# Patient Record
Sex: Male | Born: 1935 | Race: White | Hispanic: No | Smoking: Former smoker
Health system: Southern US, Community
[De-identification: ages and names within clinical notes are randomized; demographics above are authoritative.]

## PROBLEM LIST (undated history)

## (undated) DIAGNOSIS — Z8719 Personal history of other diseases of the digestive system: Secondary | ICD-10-CM

## (undated) DIAGNOSIS — F329 Major depressive disorder, single episode, unspecified: Secondary | ICD-10-CM

## (undated) DIAGNOSIS — F410 Panic disorder [episodic paroxysmal anxiety] without agoraphobia: Secondary | ICD-10-CM

## (undated) DIAGNOSIS — F32A Depression, unspecified: Secondary | ICD-10-CM

## (undated) DIAGNOSIS — R42 Dizziness and giddiness: Secondary | ICD-10-CM

## (undated) DIAGNOSIS — Z87442 Personal history of urinary calculi: Secondary | ICD-10-CM

## (undated) DIAGNOSIS — D649 Anemia, unspecified: Secondary | ICD-10-CM

## (undated) DIAGNOSIS — E785 Hyperlipidemia, unspecified: Secondary | ICD-10-CM

## (undated) DIAGNOSIS — K635 Polyp of colon: Secondary | ICD-10-CM

## (undated) DIAGNOSIS — D696 Thrombocytopenia, unspecified: Secondary | ICD-10-CM

## (undated) DIAGNOSIS — H9190 Unspecified hearing loss, unspecified ear: Secondary | ICD-10-CM

## (undated) DIAGNOSIS — Z22322 Carrier or suspected carrier of Methicillin resistant Staphylococcus aureus: Secondary | ICD-10-CM

## (undated) DIAGNOSIS — K2270 Barrett's esophagus without dysplasia: Secondary | ICD-10-CM

## (undated) DIAGNOSIS — F1020 Alcohol dependence, uncomplicated: Secondary | ICD-10-CM

## (undated) DIAGNOSIS — IMO0001 Reserved for inherently not codable concepts without codable children: Secondary | ICD-10-CM

## (undated) DIAGNOSIS — I10 Essential (primary) hypertension: Secondary | ICD-10-CM

## (undated) DIAGNOSIS — M25569 Pain in unspecified knee: Secondary | ICD-10-CM

## (undated) DIAGNOSIS — K219 Gastro-esophageal reflux disease without esophagitis: Secondary | ICD-10-CM

## (undated) DIAGNOSIS — M1990 Unspecified osteoarthritis, unspecified site: Secondary | ICD-10-CM

## (undated) HISTORY — DX: Carrier or suspected carrier of methicillin resistant Staphylococcus aureus: Z22.322

## (undated) HISTORY — DX: Barrett's esophagus without dysplasia: K22.70

## (undated) HISTORY — DX: Essential (primary) hypertension: I10

## (undated) HISTORY — DX: Reserved for inherently not codable concepts without codable children: IMO0001

## (undated) HISTORY — DX: Alcohol dependence, uncomplicated: F10.20

## (undated) HISTORY — DX: Major depressive disorder, single episode, unspecified: F32.9

## (undated) HISTORY — DX: Hyperlipidemia, unspecified: E78.5

## (undated) HISTORY — DX: Thrombocytopenia, unspecified: D69.6

## (undated) HISTORY — DX: Depression, unspecified: F32.A

## (undated) HISTORY — DX: Unspecified hearing loss, unspecified ear: H91.90

## (undated) HISTORY — DX: Anemia, unspecified: D64.9

## (undated) HISTORY — DX: Unspecified osteoarthritis, unspecified site: M19.90

## (undated) HISTORY — PX: OTHER SURGICAL HISTORY: SHX169

## (undated) HISTORY — PX: TONSILLECTOMY: SUR1361

## (undated) HISTORY — DX: Personal history of other diseases of the digestive system: Z87.19

## (undated) HISTORY — PX: APPENDECTOMY: SHX54

## (undated) HISTORY — PX: CARDIAC CATHETERIZATION: SHX172

## (undated) HISTORY — DX: Polyp of colon: K63.5

---

## 2000-10-11 ENCOUNTER — Inpatient Hospital Stay (HOSPITAL_COMMUNITY): Admission: RE | Admit: 2000-10-11 | Discharge: 2000-10-13 | Payer: Self-pay | Admitting: Emergency Medicine

## 2000-10-11 ENCOUNTER — Encounter: Payer: Self-pay | Admitting: Emergency Medicine

## 2000-11-25 ENCOUNTER — Inpatient Hospital Stay (HOSPITAL_COMMUNITY): Admission: EM | Admit: 2000-11-25 | Discharge: 2000-11-29 | Payer: Self-pay | Admitting: *Deleted

## 2000-11-25 ENCOUNTER — Encounter: Payer: Self-pay | Admitting: Emergency Medicine

## 2001-03-21 ENCOUNTER — Inpatient Hospital Stay (HOSPITAL_COMMUNITY): Admission: EM | Admit: 2001-03-21 | Discharge: 2001-03-26 | Payer: Self-pay | Admitting: Psychiatry

## 2001-03-21 ENCOUNTER — Emergency Department (HOSPITAL_COMMUNITY): Admission: EM | Admit: 2001-03-21 | Discharge: 2001-03-21 | Payer: Self-pay | Admitting: *Deleted

## 2001-10-05 ENCOUNTER — Inpatient Hospital Stay (HOSPITAL_COMMUNITY): Admission: EM | Admit: 2001-10-05 | Discharge: 2001-10-08 | Payer: Self-pay | Admitting: Psychiatry

## 2001-12-11 ENCOUNTER — Inpatient Hospital Stay (HOSPITAL_COMMUNITY): Admission: EM | Admit: 2001-12-11 | Discharge: 2001-12-15 | Payer: Self-pay | Admitting: Psychiatry

## 2002-02-20 ENCOUNTER — Inpatient Hospital Stay (HOSPITAL_COMMUNITY): Admission: EM | Admit: 2002-02-20 | Discharge: 2002-02-22 | Payer: Self-pay | Admitting: *Deleted

## 2002-03-01 ENCOUNTER — Encounter: Admission: RE | Admit: 2002-03-01 | Discharge: 2002-03-01 | Payer: Self-pay | Admitting: Family Medicine

## 2002-06-14 ENCOUNTER — Encounter: Payer: Self-pay | Admitting: Emergency Medicine

## 2002-06-14 ENCOUNTER — Inpatient Hospital Stay (HOSPITAL_COMMUNITY): Admission: EM | Admit: 2002-06-14 | Discharge: 2002-06-19 | Payer: Self-pay | Admitting: Emergency Medicine

## 2002-10-22 ENCOUNTER — Emergency Department (HOSPITAL_COMMUNITY): Admission: EM | Admit: 2002-10-22 | Discharge: 2002-10-23 | Payer: Self-pay | Admitting: Emergency Medicine

## 2002-10-22 ENCOUNTER — Encounter: Payer: Self-pay | Admitting: Emergency Medicine

## 2002-10-23 ENCOUNTER — Inpatient Hospital Stay (HOSPITAL_COMMUNITY): Admission: EM | Admit: 2002-10-23 | Discharge: 2002-10-26 | Payer: Self-pay | Admitting: Psychiatry

## 2003-01-01 ENCOUNTER — Emergency Department (HOSPITAL_COMMUNITY): Admission: AD | Admit: 2003-01-01 | Discharge: 2003-01-02 | Payer: Self-pay | Admitting: Emergency Medicine

## 2004-01-13 ENCOUNTER — Emergency Department (HOSPITAL_COMMUNITY): Admission: EM | Admit: 2004-01-13 | Discharge: 2004-01-13 | Payer: Self-pay | Admitting: *Deleted

## 2005-03-25 ENCOUNTER — Ambulatory Visit (HOSPITAL_COMMUNITY): Admission: RE | Admit: 2005-03-25 | Discharge: 2005-03-25 | Payer: Self-pay | Admitting: Ophthalmology

## 2005-11-03 ENCOUNTER — Emergency Department (HOSPITAL_COMMUNITY): Admission: EM | Admit: 2005-11-03 | Discharge: 2005-11-04 | Payer: Self-pay | Admitting: Emergency Medicine

## 2006-03-10 ENCOUNTER — Inpatient Hospital Stay (HOSPITAL_COMMUNITY): Admission: EM | Admit: 2006-03-10 | Discharge: 2006-03-12 | Payer: Self-pay | Admitting: Emergency Medicine

## 2006-03-21 ENCOUNTER — Inpatient Hospital Stay (HOSPITAL_COMMUNITY): Admission: EM | Admit: 2006-03-21 | Discharge: 2006-03-24 | Payer: Self-pay | Admitting: Emergency Medicine

## 2006-06-28 ENCOUNTER — Emergency Department (HOSPITAL_COMMUNITY): Admission: EM | Admit: 2006-06-28 | Discharge: 2006-06-28 | Payer: Self-pay | Admitting: Emergency Medicine

## 2006-06-29 ENCOUNTER — Emergency Department (HOSPITAL_COMMUNITY): Admission: EM | Admit: 2006-06-29 | Discharge: 2006-06-29 | Payer: Self-pay | Admitting: Emergency Medicine

## 2006-08-23 ENCOUNTER — Emergency Department (HOSPITAL_COMMUNITY): Admission: EM | Admit: 2006-08-23 | Discharge: 2006-08-24 | Payer: Self-pay | Admitting: Emergency Medicine

## 2006-10-19 ENCOUNTER — Inpatient Hospital Stay (HOSPITAL_COMMUNITY): Admission: EM | Admit: 2006-10-19 | Discharge: 2006-10-22 | Payer: Self-pay | Admitting: Emergency Medicine

## 2006-12-23 ENCOUNTER — Inpatient Hospital Stay (HOSPITAL_COMMUNITY): Admission: EM | Admit: 2006-12-23 | Discharge: 2006-12-24 | Payer: Self-pay | Admitting: Emergency Medicine

## 2006-12-24 ENCOUNTER — Ambulatory Visit: Payer: Self-pay | Admitting: Psychiatry

## 2006-12-24 ENCOUNTER — Inpatient Hospital Stay (HOSPITAL_COMMUNITY): Admission: AD | Admit: 2006-12-24 | Discharge: 2006-12-28 | Payer: Self-pay | Admitting: Psychiatry

## 2007-02-04 ENCOUNTER — Ambulatory Visit: Payer: Self-pay | Admitting: Orthopedic Surgery

## 2007-03-13 ENCOUNTER — Inpatient Hospital Stay (HOSPITAL_COMMUNITY): Admission: EM | Admit: 2007-03-13 | Discharge: 2007-03-15 | Payer: Self-pay | Admitting: Emergency Medicine

## 2007-04-30 ENCOUNTER — Ambulatory Visit: Payer: Self-pay | Admitting: Internal Medicine

## 2007-04-30 ENCOUNTER — Inpatient Hospital Stay (HOSPITAL_COMMUNITY): Admission: EM | Admit: 2007-04-30 | Discharge: 2007-05-03 | Payer: Self-pay | Admitting: Emergency Medicine

## 2007-04-30 HISTORY — PX: ESOPHAGOGASTRODUODENOSCOPY: SHX1529

## 2007-05-01 ENCOUNTER — Ambulatory Visit: Payer: Self-pay | Admitting: Internal Medicine

## 2007-05-01 ENCOUNTER — Encounter: Payer: Self-pay | Admitting: Internal Medicine

## 2007-05-03 HISTORY — PX: EUS: SHX5427

## 2007-05-13 ENCOUNTER — Encounter: Payer: Self-pay | Admitting: Internal Medicine

## 2007-05-13 ENCOUNTER — Ambulatory Visit: Payer: Self-pay | Admitting: Internal Medicine

## 2007-05-13 ENCOUNTER — Ambulatory Visit (HOSPITAL_COMMUNITY): Admission: RE | Admit: 2007-05-13 | Discharge: 2007-05-13 | Payer: Self-pay | Admitting: Internal Medicine

## 2007-05-13 HISTORY — PX: COLONOSCOPY: SHX174

## 2007-05-20 ENCOUNTER — Encounter: Payer: Self-pay | Admitting: Gastroenterology

## 2007-05-20 ENCOUNTER — Ambulatory Visit (HOSPITAL_COMMUNITY): Admission: RE | Admit: 2007-05-20 | Discharge: 2007-05-20 | Payer: Self-pay | Admitting: Gastroenterology

## 2007-05-25 ENCOUNTER — Ambulatory Visit: Payer: Self-pay | Admitting: Gastroenterology

## 2007-06-09 ENCOUNTER — Emergency Department (HOSPITAL_COMMUNITY): Admission: EM | Admit: 2007-06-09 | Discharge: 2007-06-10 | Payer: Self-pay | Admitting: Emergency Medicine

## 2007-08-13 ENCOUNTER — Emergency Department (HOSPITAL_COMMUNITY): Admission: EM | Admit: 2007-08-13 | Discharge: 2007-08-14 | Payer: Self-pay | Admitting: Emergency Medicine

## 2007-08-24 ENCOUNTER — Ambulatory Visit: Payer: Self-pay | Admitting: Orthopedic Surgery

## 2007-09-01 ENCOUNTER — Ambulatory Visit: Payer: Self-pay | Admitting: Orthopedic Surgery

## 2007-09-17 ENCOUNTER — Emergency Department (HOSPITAL_COMMUNITY): Admission: EM | Admit: 2007-09-17 | Discharge: 2007-09-18 | Payer: Self-pay | Admitting: Emergency Medicine

## 2008-01-15 ENCOUNTER — Emergency Department (HOSPITAL_COMMUNITY): Admission: EM | Admit: 2008-01-15 | Discharge: 2008-01-15 | Payer: Self-pay | Admitting: Emergency Medicine

## 2008-01-22 ENCOUNTER — Emergency Department (HOSPITAL_COMMUNITY): Admission: EM | Admit: 2008-01-22 | Discharge: 2008-01-22 | Payer: Self-pay | Admitting: Emergency Medicine

## 2008-02-09 ENCOUNTER — Ambulatory Visit (HOSPITAL_COMMUNITY): Admission: RE | Admit: 2008-02-09 | Discharge: 2008-02-09 | Payer: Self-pay | Admitting: Family Medicine

## 2008-03-20 ENCOUNTER — Other Ambulatory Visit: Payer: Self-pay | Admitting: Emergency Medicine

## 2008-03-20 ENCOUNTER — Ambulatory Visit: Payer: Self-pay | Admitting: *Deleted

## 2008-03-20 ENCOUNTER — Inpatient Hospital Stay (HOSPITAL_COMMUNITY): Admission: RE | Admit: 2008-03-20 | Discharge: 2008-03-24 | Payer: Self-pay | Admitting: *Deleted

## 2008-11-17 ENCOUNTER — Encounter (INDEPENDENT_AMBULATORY_CARE_PROVIDER_SITE_OTHER): Payer: Self-pay | Admitting: *Deleted

## 2008-12-17 ENCOUNTER — Inpatient Hospital Stay (HOSPITAL_COMMUNITY): Admission: EM | Admit: 2008-12-17 | Discharge: 2008-12-20 | Payer: Self-pay | Admitting: Emergency Medicine

## 2009-01-05 IMAGING — CR DG CHEST 2V
2 series · 2 of 2 positions shown · non-contrast
Comparison: 01/15/2008

CLINICAL DATA: Shortness of breath, chest tightness, cough,
congestion

CHEST - 2 VIEW

[view not recorded (1 of 2)]
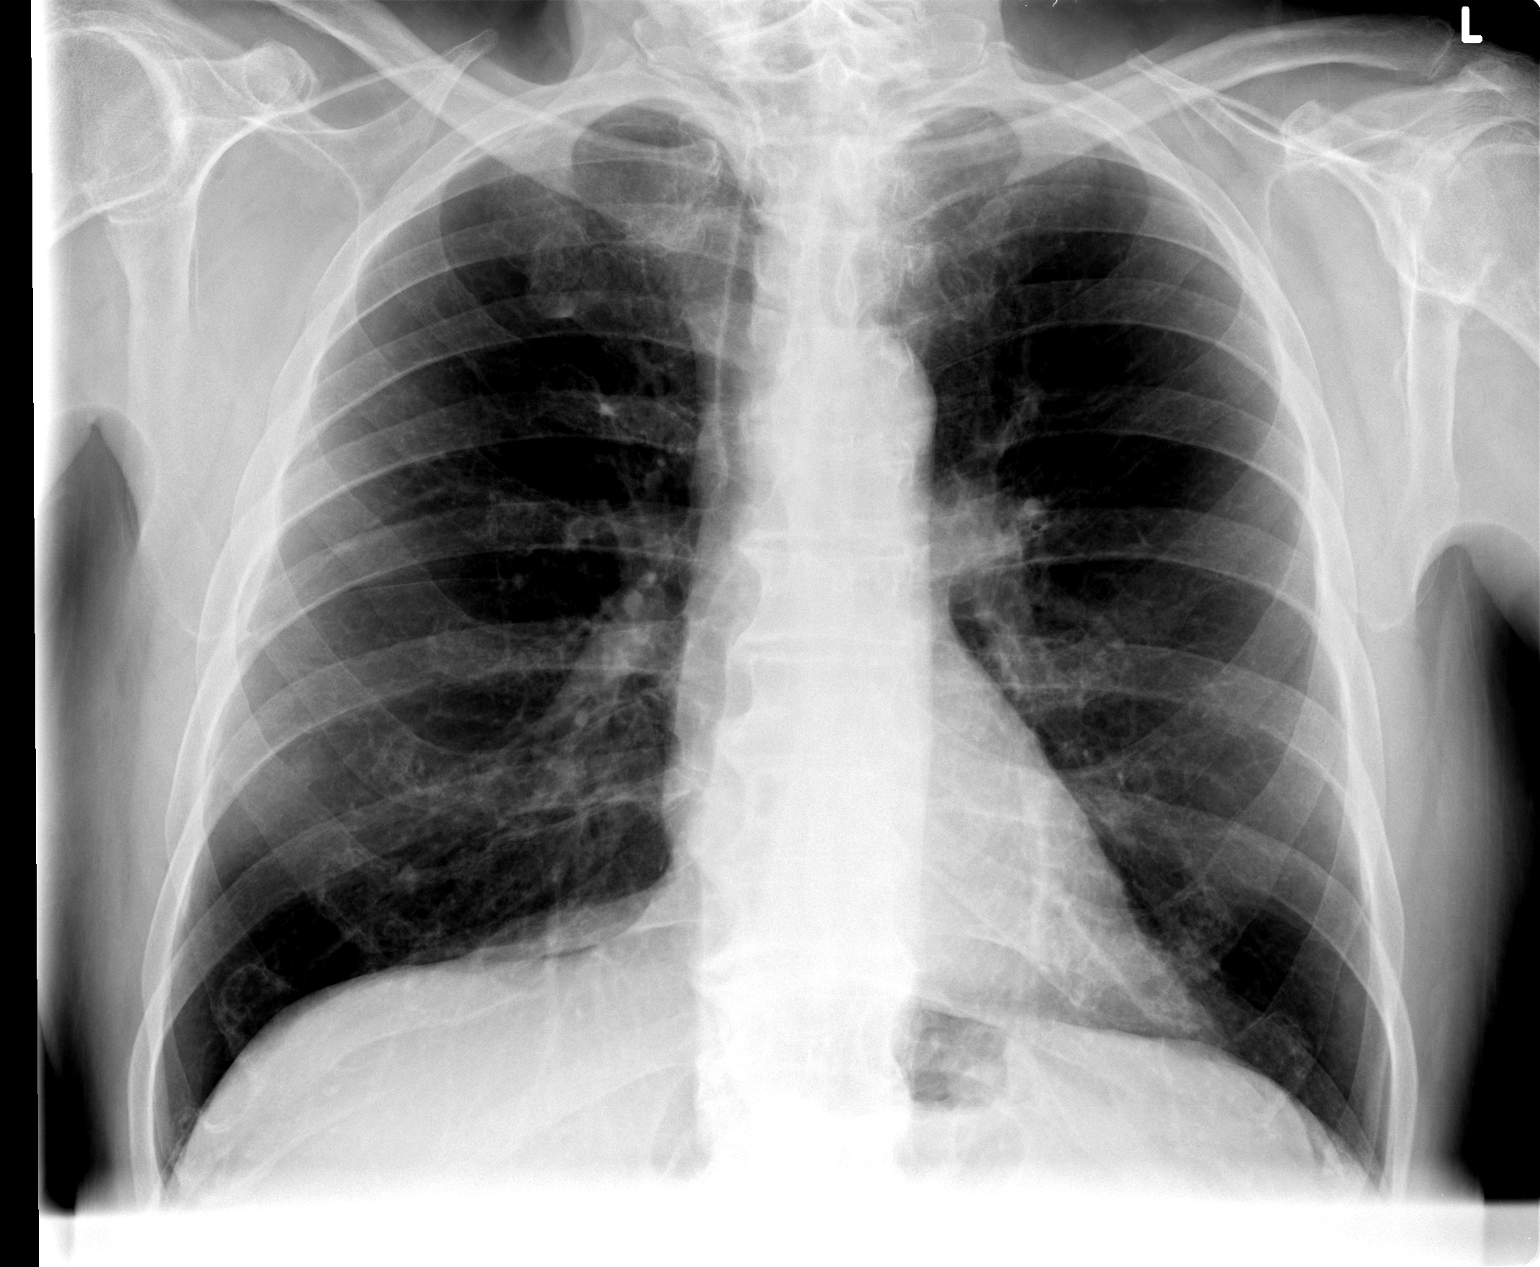

[view not recorded (2 of 2)]
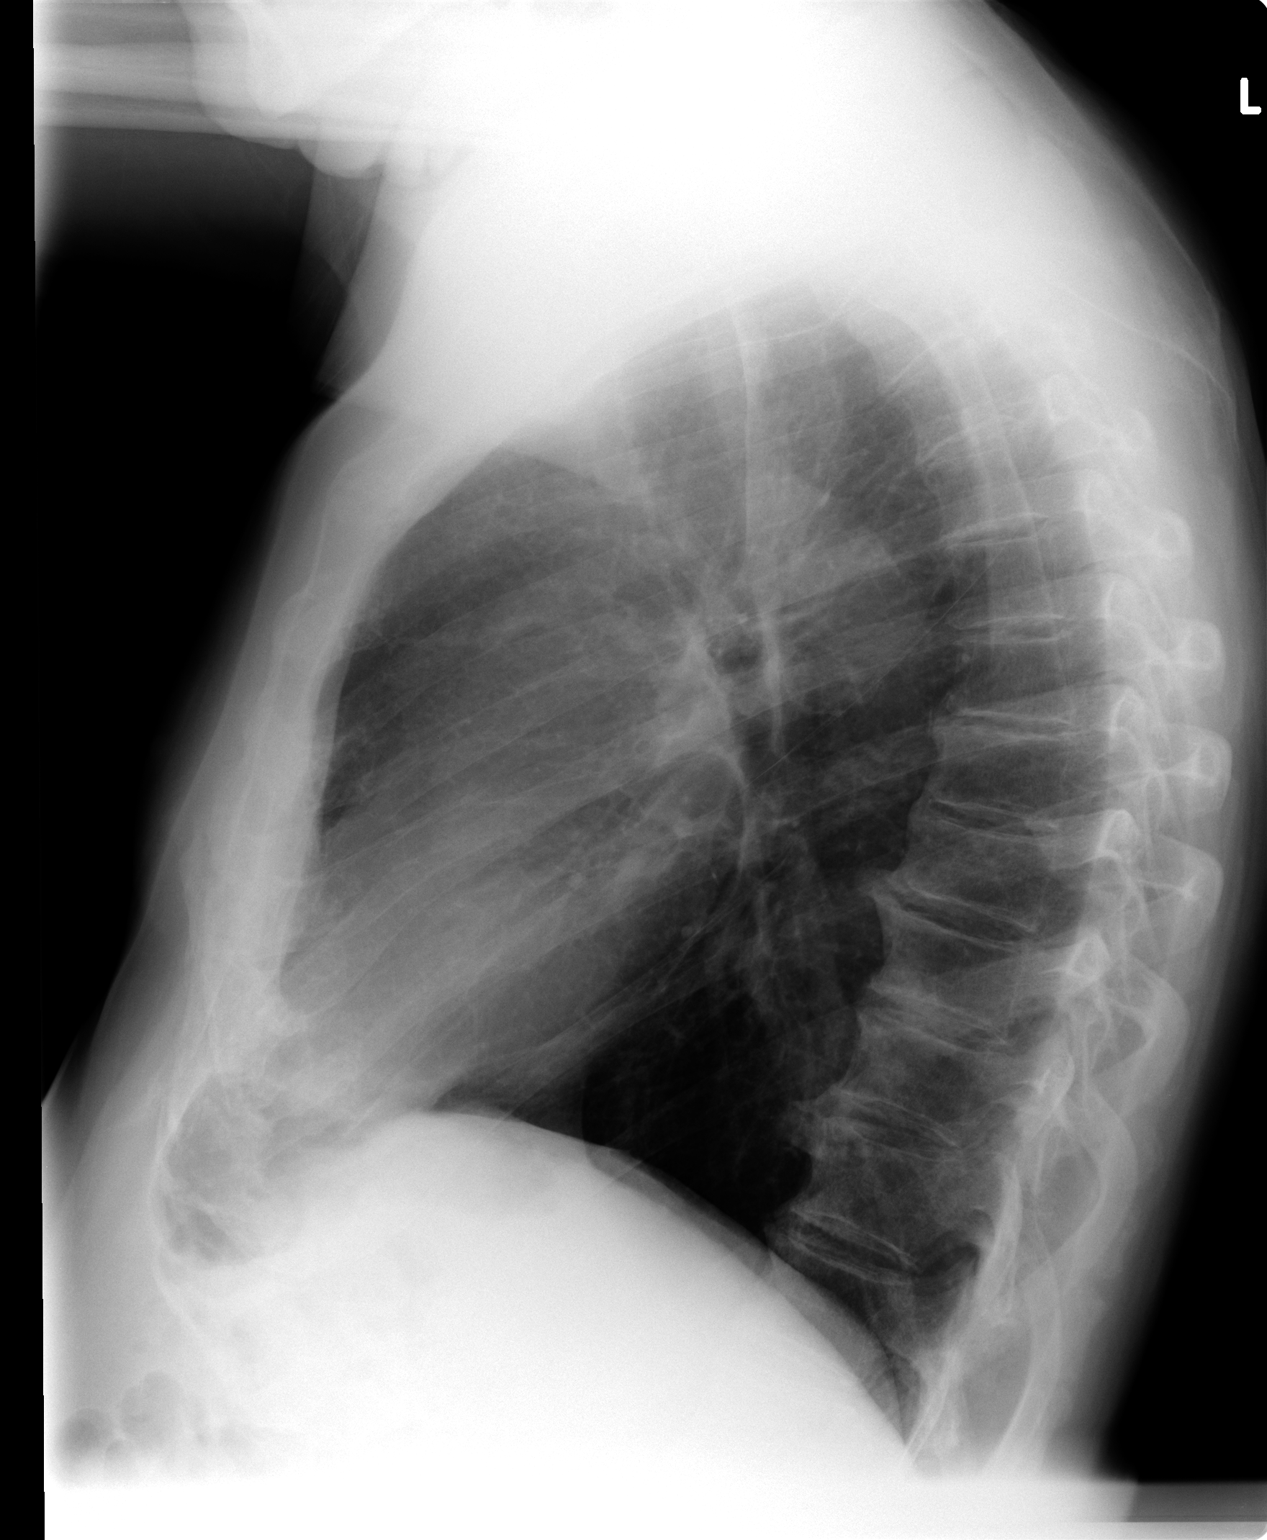

[2 of 2 positions shown; findings below may reference images not displayed]

FINDINGS: Normal heart size, mediastinal contours, and pulmonary vascularity.
Emphysematous changes without infiltrate or effusion.
No pneumothorax.
Endplate spur formation thoracic spine.
IMPRESSION: Question COPD.
No acute abnormalities.

## 2009-06-18 ENCOUNTER — Emergency Department (HOSPITAL_COMMUNITY): Admission: EM | Admit: 2009-06-18 | Discharge: 2009-06-18 | Payer: Self-pay | Admitting: Emergency Medicine

## 2009-06-25 ENCOUNTER — Emergency Department (HOSPITAL_COMMUNITY): Admission: EM | Admit: 2009-06-25 | Discharge: 2009-06-25 | Payer: Self-pay | Admitting: Emergency Medicine

## 2009-08-31 HISTORY — PX: KNEE SURGERY: SHX244

## 2009-09-11 ENCOUNTER — Ambulatory Visit: Payer: Self-pay | Admitting: Cardiology

## 2009-09-11 ENCOUNTER — Ambulatory Visit: Payer: Self-pay | Admitting: Cardiovascular Disease

## 2009-09-11 ENCOUNTER — Inpatient Hospital Stay (HOSPITAL_COMMUNITY): Admission: AD | Admit: 2009-09-11 | Discharge: 2009-09-13 | Payer: Self-pay | Admitting: Cardiovascular Disease

## 2009-09-16 ENCOUNTER — Emergency Department (HOSPITAL_COMMUNITY): Admission: EM | Admit: 2009-09-16 | Discharge: 2009-09-16 | Payer: Self-pay | Admitting: Emergency Medicine

## 2009-09-21 ENCOUNTER — Ambulatory Visit: Payer: Self-pay | Admitting: Orthopedic Surgery

## 2009-09-21 ENCOUNTER — Inpatient Hospital Stay (HOSPITAL_COMMUNITY): Admission: EM | Admit: 2009-09-21 | Discharge: 2009-10-02 | Payer: Self-pay | Admitting: Emergency Medicine

## 2009-09-22 ENCOUNTER — Encounter: Payer: Self-pay | Admitting: Orthopedic Surgery

## 2009-10-03 ENCOUNTER — Telehealth: Payer: Self-pay | Admitting: Orthopedic Surgery

## 2009-10-17 ENCOUNTER — Encounter (HOSPITAL_COMMUNITY): Admission: RE | Admit: 2009-10-17 | Discharge: 2009-11-16 | Payer: Self-pay | Admitting: Family Medicine

## 2009-10-17 ENCOUNTER — Ambulatory Visit (HOSPITAL_COMMUNITY): Payer: Self-pay | Admitting: Family Medicine

## 2009-10-18 ENCOUNTER — Ambulatory Visit: Payer: Self-pay | Admitting: Orthopedic Surgery

## 2009-10-18 DIAGNOSIS — M009 Pyogenic arthritis, unspecified: Secondary | ICD-10-CM | POA: Insufficient documentation

## 2009-12-20 ENCOUNTER — Encounter: Payer: Self-pay | Admitting: Orthopedic Surgery

## 2010-01-04 ENCOUNTER — Encounter: Payer: Self-pay | Admitting: Orthopedic Surgery

## 2010-01-15 ENCOUNTER — Emergency Department (HOSPITAL_COMMUNITY): Admission: EM | Admit: 2010-01-15 | Discharge: 2010-01-16 | Payer: Self-pay | Admitting: Emergency Medicine

## 2010-06-11 ENCOUNTER — Encounter (INDEPENDENT_AMBULATORY_CARE_PROVIDER_SITE_OTHER): Payer: Self-pay

## 2010-07-02 NOTE — Progress Notes (Signed)
Summary: call from [facility] for order  Phone Note From Other Clinic   Caller: Nurse Summary of Call: Erskine Squibb, Chiropodist at [facility] [phone]- called to request order re: Cryocuff. States patient admittedt to their facility. Please advise. Initial call taken by: Cammie Sickle,  Oct 03, 2009 2:26 PM  Follow-up for Phone Call        daily 30 minutes  Follow-up by: Fuller Canada MD,  Oct 03, 2009 2:29 PM  Additional Follow-up for Phone Call Additional follow up Details #1::        advised. Additional Follow-up by: Cammie Sickle,  Oct 03, 2009 3:39 PM

## 2010-07-02 NOTE — Assessment & Plan Note (Signed)
Summary: HOSP FOL/UP/POST OP RT KNEE/SURG 09/28/09/MEDICARE ONLY/CAF   Visit Type:  Follow-up  CC:  post op hospital fu.  History of Present Illness: I saw Paulino Piscopo in the office today for a followup visit.  He is a 77 years old man with the complaint of:  post op 1 arthroscopy right knee, debridement and lavage for infection and partial medial menisectomy, MRSA infection.  DOS 09/28/09 after being admitted to the hospital with severe knee pain and swelling.  Vanc trough for review from 10/16/09 ordered by Dr. Sudie Bailey  Meds to be scanned, takes Vanc IV Vicodin for pain helps.  Has therapy everyday at [facility].  Stiff right knee miniaml effusion   ROM was 100-60   Continue PT and IV meds        Allergies: 1)  ! * Aspirin   Impression & Recommendations:  Problem # 1:  PYOGENIC ARTHRITIS, LOWER LEG (ICD-711.06) Assessment Improved  Orders: Post-Op Check (03474)  Patient Instructions: 1)  Please schedule a follow-up appointment in 2 months.

## 2010-07-02 NOTE — Letter (Signed)
Summary: *Orthopedic No Show Letter  [facility]  [address] [place] [address]  [place], [place] [address]   Phone: [phone]  Fax: [phone]       01/04/2010    Lucus L. Knee [address] [place], [place]  [address]    Dear Mr. KIENER,   Our records indicate that you missed your re-scheduled appointment with Dr. Beaulah Corin on 01/03/10.    Please contact this office to reschedule your appointment as soon as possible.  It is important that you keep your scheduled appointments with your physician, so we can provide you the best care possible.        Sincerely,    Dr. Terrance Mass, MD [facility] Phone [phone]

## 2010-07-02 NOTE — Letter (Signed)
Summary: *Orthopedic No Show Letter  [facility]  [address] [place] [address]  [place], [place] [address]   Phone: [phone]  Fax: [phone]      12/20/2009    [name] [address] [place], [place]  [address]      Dear [name],   Our records indicate that you missed your scheduled post-operative appointment with Dr. Beaulah Corin on Wednesday, 12/19/2009.    Please contact this office to reschedule your appointment as soon as possible.  It is important that you keep your scheduled appointments with your physician, so we can provide you the best care possible.        Sincerely,    Dr. Terrance Mass, MD [facility] Phone [phone]

## 2010-07-02 NOTE — Consult Note (Signed)
Summary: Hospital Consult RT knee  Hospital Consult RT knee   Imported By: Cammie Sickle 10/13/2009 17:35:29  _____________________________________________________________________  External Attachment:    Type:   Image     Comment:   External Document

## 2010-07-04 NOTE — Letter (Signed)
Summary: Recall, Screening Colonoscopy Only  [facility]  [address]   [place], [place] [address]   Phone: [phone]  Fax: [phone]    June 11, 2010  [name] [address] [place], [place]  [address] 1927/08/30   Dear [name],   Our records indicate it is time to schedule your colonoscopy.     Please call our office at [phone] and ask for the nurse.   Thank you,  Hendricks Limes, LPN Cloria Spring, LPN  [facility] Ph: [phone]   Fax: [phone]

## 2010-07-05 NOTE — Miscellaneous (Signed)
Summary: Nursing Home order  Nursing Home order   Imported By: Cammie Sickle 10/19/2009 10:19:36  _____________________________________________________________________  External Attachment:    Type:   Image     Comment:   External Document

## 2010-08-15 LAB — DIFFERENTIAL
Basophils Relative: 1 % (ref 0–1)
Eosinophils Absolute: 0.1 10*3/uL (ref 0.0–0.7)
Monocytes Absolute: 0.6 10*3/uL (ref 0.1–1.0)
Monocytes Relative: 9 % (ref 3–12)

## 2010-08-15 LAB — URINALYSIS, ROUTINE W REFLEX MICROSCOPIC
Bilirubin Urine: NEGATIVE
Nitrite: NEGATIVE
Protein, ur: NEGATIVE mg/dL
Specific Gravity, Urine: 1.02 (ref 1.005–1.030)
Urobilinogen, UA: 0.2 mg/dL (ref 0.0–1.0)

## 2010-08-15 LAB — BASIC METABOLIC PANEL
BUN: 9 mg/dL (ref 6–23)
CO2: 23 mEq/L (ref 19–32)
Calcium: 8.1 mg/dL — ABNORMAL LOW (ref 8.4–10.5)
GFR calc non Af Amer: >60 mL/min (ref >60)
Glucose, Bld: 131 mg/dL — ABNORMAL HIGH (ref 70–99)

## 2010-08-15 LAB — URINE MICROSCOPIC-ADD ON

## 2010-08-15 LAB — CBC
HCT: 36.9 % — ABNORMAL LOW (ref 39.0–52.0)
Hemoglobin: 12.4 g/dL — ABNORMAL LOW (ref 13.0–17.0)
WBC: 6.2 10*3/uL (ref 4.0–10.5)

## 2010-08-15 LAB — ETHANOL: Alcohol, Ethyl (B): 342 mg/dL — ABNORMAL HIGH (ref 0–10)

## 2010-08-18 LAB — DIFFERENTIAL
Basophils Absolute: 0.1 10*3/uL (ref 0.0–0.1)
Monocytes Relative: 7 % (ref 3–12)
Neutrophils Relative %: 83 % — ABNORMAL HIGH (ref 43–77)

## 2010-08-18 LAB — POCT CARDIAC MARKERS
Myoglobin, poc: 245 ng/mL (ref 12–200)
Troponin i, poc: <0.05 ng/mL (ref 0.00–0.09)

## 2010-08-18 LAB — POCT I-STAT, CHEM 8
Calcium, Ion: 1.13 mmol/L (ref 1.12–1.32)
HCT: 38 % — ABNORMAL LOW (ref 39.0–52.0)
Potassium: 3.5 mEq/L (ref 3.5–5.1)
Sodium: 136 mEq/L (ref 135–145)

## 2010-08-18 LAB — CBC
Hemoglobin: 11.3 g/dL — ABNORMAL LOW (ref 13.0–17.0)
RBC: 4.83 MIL/uL (ref 4.22–5.81)
RDW: 18.2 % — ABNORMAL HIGH (ref 11.5–15.5)

## 2010-08-20 LAB — BASIC METABOLIC PANEL
BUN: 13 mg/dL (ref 6–23)
BUN: 9 mg/dL (ref 6–23)
CO2: 26 mEq/L (ref 19–32)
CO2: 26 mEq/L (ref 19–32)
CO2: 28 mEq/L (ref 19–32)
CO2: 29 mEq/L (ref 19–32)
Calcium: 8 mg/dL — ABNORMAL LOW (ref 8.4–10.5)
Calcium: 8.1 mg/dL — ABNORMAL LOW (ref 8.4–10.5)
Chloride: 103 mEq/L (ref 96–112)
Chloride: 101 mEq/L (ref 96–112)
Chloride: 101 mEq/L (ref 96–112)
Chloride: 102 mEq/L (ref 96–112)
Chloride: 102 mEq/L (ref 96–112)
Chloride: 103 mEq/L (ref 96–112)
Creatinine, Ser: 0.67 mg/dL (ref 0.4–1.5)
GFR calc Af Amer: >60 mL/min (ref >60)
GFR calc Af Amer: >60 mL/min (ref >60)
GFR calc Af Amer: >60 mL/min (ref >60)
GFR calc Af Amer: >60 mL/min (ref >60)
GFR calc Af Amer: >60 mL/min (ref >60)
GFR calc non Af Amer: >60 mL/min (ref >60)
GFR calc non Af Amer: >60 mL/min (ref >60)
Glucose, Bld: 143 mg/dL — ABNORMAL HIGH (ref 70–99)
Potassium: 3.7 mEq/L (ref 3.5–5.1)
Potassium: 3 mEq/L — ABNORMAL LOW (ref 3.5–5.1)
Potassium: 3.2 mEq/L — ABNORMAL LOW (ref 3.5–5.1)
Potassium: 3.4 mEq/L — ABNORMAL LOW (ref 3.5–5.1)
Potassium: 3.5 mEq/L (ref 3.5–5.1)
Potassium: 3.6 mEq/L (ref 3.5–5.1)
Potassium: 3.9 mEq/L (ref 3.5–5.1)
Sodium: 136 mEq/L (ref 135–145)
Sodium: 132 mEq/L — ABNORMAL LOW (ref 135–145)
Sodium: 135 mEq/L (ref 135–145)
Sodium: 137 mEq/L (ref 135–145)

## 2010-08-20 LAB — DIFFERENTIAL
Basophils Absolute: 0.2 10*3/uL — ABNORMAL HIGH (ref 0.0–0.1)
Basophils Relative: 0 % (ref 0–1)
Basophils Relative: 1 % (ref 0–1)
Eosinophils Absolute: 0 10*3/uL (ref 0.0–0.7)
Eosinophils Absolute: 0 10*3/uL (ref 0.0–0.7)
Eosinophils Absolute: 0 10*3/uL (ref 0.0–0.7)
Eosinophils Absolute: 0.1 10*3/uL (ref 0.0–0.7)
Eosinophils Relative: 0 % (ref 0–5)
Eosinophils Relative: 0 % (ref 0–5)
Eosinophils Relative: 1 % (ref 0–5)
Lymphs Abs: 0.8 10*3/uL (ref 0.7–4.0)
Lymphs Abs: 0.8 10*3/uL (ref 0.7–4.0)
Lymphs Abs: 0.9 10*3/uL (ref 0.7–4.0)
Monocytes Absolute: 0.7 10*3/uL (ref 0.1–1.0)
Monocytes Absolute: 0.7 10*3/uL (ref 0.1–1.0)
Monocytes Absolute: 1 10*3/uL (ref 0.1–1.0)
Monocytes Relative: 8 % (ref 3–12)
Monocytes Relative: 10 % (ref 3–12)
Monocytes Relative: 8 % (ref 3–12)
Monocytes Relative: 9 % (ref 3–12)
Neutro Abs: 8.8 10*3/uL — ABNORMAL HIGH (ref 1.7–7.7)
Neutro Abs: 8.8 10*3/uL — ABNORMAL HIGH (ref 1.7–7.7)
Neutrophils Relative %: 83 % — ABNORMAL HIGH (ref 43–77)
Neutrophils Relative %: 83 % — ABNORMAL HIGH (ref 43–77)

## 2010-08-20 LAB — URINE MICROSCOPIC-ADD ON

## 2010-08-20 LAB — CROSSMATCH: Antibody Screen: NEGATIVE

## 2010-08-20 LAB — COMPREHENSIVE METABOLIC PANEL
ALT: 49 U/L (ref 0–53)
Alkaline Phosphatase: 73 U/L (ref 39–117)
BUN: 16 mg/dL (ref 6–23)
CO2: 27 mEq/L (ref 19–32)
Chloride: 99 mEq/L (ref 96–112)
GFR calc non Af Amer: >60 mL/min (ref >60)
Glucose, Bld: 112 mg/dL — ABNORMAL HIGH (ref 70–99)
Potassium: 2.6 mEq/L — CL (ref 3.5–5.1)
Total Bilirubin: 1.1 mg/dL (ref 0.3–1.2)

## 2010-08-20 LAB — URINALYSIS, ROUTINE W REFLEX MICROSCOPIC
Bilirubin Urine: NEGATIVE
Glucose, UA: NEGATIVE mg/dL
Specific Gravity, Urine: >1.03 — ABNORMAL HIGH (ref 1.005–1.030)
pH: 5.5 (ref 5.0–8.0)

## 2010-08-20 LAB — CBC
HCT: 29 % — ABNORMAL LOW (ref 39.0–52.0)
HCT: 26.2 % — ABNORMAL LOW (ref 39.0–52.0)
HCT: 29.8 % — ABNORMAL LOW (ref 39.0–52.0)
HCT: 29.9 % — ABNORMAL LOW (ref 39.0–52.0)
HCT: 31.4 % — ABNORMAL LOW (ref 39.0–52.0)
Hemoglobin: 9.8 g/dL — ABNORMAL LOW (ref 13.0–17.0)
Hemoglobin: 10.5 g/dL — ABNORMAL LOW (ref 13.0–17.0)
Hemoglobin: 8.7 g/dL — ABNORMAL LOW (ref 13.0–17.0)
MCHC: 33.7 g/dL (ref 30.0–36.0)
MCHC: 33.2 g/dL (ref 30.0–36.0)
MCV: 75.9 fL — ABNORMAL LOW (ref 78.0–100.0)
MCV: 72.5 fL — ABNORMAL LOW (ref 78.0–100.0)
MCV: 75.5 fL — ABNORMAL LOW (ref 78.0–100.0)
MCV: 75.5 fL — ABNORMAL LOW (ref 78.0–100.0)
RBC: 3.82 MIL/uL — ABNORMAL LOW (ref 4.22–5.81)
RBC: 3.62 MIL/uL — ABNORMAL LOW (ref 4.22–5.81)
RBC: 3.95 MIL/uL — ABNORMAL LOW (ref 4.22–5.81)
RBC: 3.96 MIL/uL — ABNORMAL LOW (ref 4.22–5.81)
RBC: 4.3 MIL/uL (ref 4.22–5.81)
RDW: 17.8 % — ABNORMAL HIGH (ref 11.5–15.5)
RDW: 17.9 % — ABNORMAL HIGH (ref 11.5–15.5)
WBC: 8.8 10*3/uL (ref 4.0–10.5)
WBC: 10.2 10*3/uL (ref 4.0–10.5)
WBC: 10.6 10*3/uL — ABNORMAL HIGH (ref 4.0–10.5)
WBC: 8.7 10*3/uL (ref 4.0–10.5)

## 2010-08-20 LAB — BODY FLUID CULTURE

## 2010-08-20 LAB — SYNOVIAL CELL COUNT + DIFF, W/ CRYSTALS
Crystals, Fluid: NONE SEEN
Eosinophils-Synovial: 0 % (ref 0–1)
Monocyte-Macrophage-Synovial Fluid: 1 % — ABNORMAL LOW (ref 50–90)
WBC, Synovial: 40000 /mm3 — ABNORMAL HIGH (ref 0–200)

## 2010-08-20 LAB — GLUCOSE, CAPILLARY: Glucose-Capillary: 120 mg/dL — ABNORMAL HIGH (ref 70–99)

## 2010-08-20 LAB — CULTURE, ASPIRATE-QUANTITATIVE

## 2010-08-20 LAB — OCCULT BLOOD (STOOL CUP TO LAB): Fecal Occult Bld: NEGATIVE

## 2010-08-21 LAB — LIPID PANEL
Cholesterol: 164 mg/dL (ref 0–200)
LDL Cholesterol: 110 mg/dL — ABNORMAL HIGH (ref 0–99)
Triglycerides: 94 mg/dL (ref <150)
VLDL: 19 mg/dL (ref 0–40)

## 2010-08-21 LAB — CLOSTRIDIUM DIFFICILE EIA: C difficile Toxins A+B, EIA: NEGATIVE

## 2010-08-21 LAB — CBC
HCT: 29.8 % — ABNORMAL LOW (ref 39.0–52.0)
MCV: 74.7 fL — ABNORMAL LOW (ref 78.0–100.0)
Platelets: 219 10*3/uL (ref 150–400)
RBC: 3.99 MIL/uL — ABNORMAL LOW (ref 4.22–5.81)
RDW: 18.1 % — ABNORMAL HIGH (ref 11.5–15.5)
WBC: 5.7 10*3/uL (ref 4.0–10.5)
WBC: 8.5 10*3/uL (ref 4.0–10.5)

## 2010-08-21 LAB — CARDIAC PANEL(CRET KIN+CKTOT+MB+TROPI)
CK, MB: 1.4 ng/mL (ref 0.3–4.0)
Relative Index: 1.1 (ref 0.0–2.5)
Relative Index: 1.2 (ref 0.0–2.5)

## 2010-08-21 LAB — BASIC METABOLIC PANEL
BUN: 21 mg/dL (ref 6–23)
Calcium: 8.8 mg/dL (ref 8.4–10.5)
Creatinine, Ser: 1.01 mg/dL (ref 0.4–1.5)
GFR calc non Af Amer: >60 mL/min (ref >60)

## 2010-08-21 LAB — COMPREHENSIVE METABOLIC PANEL
AST: 21 U/L (ref 0–37)
CO2: 26 mEq/L (ref 19–32)
Chloride: 110 mEq/L (ref 96–112)
Creatinine, Ser: 0.84 mg/dL (ref 0.4–1.5)
GFR calc Af Amer: >60 mL/min (ref >60)
GFR calc non Af Amer: >60 mL/min (ref >60)
Total Bilirubin: 0.7 mg/dL (ref 0.3–1.2)

## 2010-08-21 LAB — APTT: aPTT: 30 seconds (ref 24–37)

## 2010-09-08 LAB — CBC
Hemoglobin: 11.6 g/dL — ABNORMAL LOW (ref 13.0–17.0)
MCHC: 33.3 g/dL (ref 30.0–36.0)
Platelets: 196 10*3/uL (ref 150–400)
RDW: 22.1 % — ABNORMAL HIGH (ref 11.5–15.5)

## 2010-09-08 LAB — HEPATIC FUNCTION PANEL
ALT: 17 U/L (ref 0–53)
AST: 44 U/L — ABNORMAL HIGH (ref 0–37)
Albumin: 4.3 g/dL (ref 3.5–5.2)
Alkaline Phosphatase: 80 U/L (ref 39–117)
Bilirubin, Direct: 0.3 mg/dL (ref 0.0–0.3)
Indirect Bilirubin: 1.3 mg/dL — ABNORMAL HIGH (ref 0.3–0.9)
Total Bilirubin: 1.6 mg/dL — ABNORMAL HIGH (ref 0.3–1.2)
Total Protein: 7.6 g/dL (ref 6.0–8.3)

## 2010-09-08 LAB — BASIC METABOLIC PANEL
CO2: 17 mEq/L — ABNORMAL LOW (ref 19–32)
CO2: 22 mEq/L (ref 19–32)
Calcium: 7.8 mg/dL — ABNORMAL LOW (ref 8.4–10.5)
Calcium: 9.2 mg/dL (ref 8.4–10.5)
Creatinine, Ser: 0.82 mg/dL (ref 0.4–1.5)
Creatinine, Ser: 1.67 mg/dL — ABNORMAL HIGH (ref 0.4–1.5)
GFR calc Af Amer: 48 mL/min — ABNORMAL LOW (ref >60)
GFR calc non Af Amer: 40 mL/min — ABNORMAL LOW (ref >60)
GFR calc non Af Amer: >60 mL/min (ref >60)
Glucose, Bld: 101 mg/dL — ABNORMAL HIGH (ref 70–99)
Glucose, Bld: 108 mg/dL — ABNORMAL HIGH (ref 70–99)
Sodium: 131 mEq/L — ABNORMAL LOW (ref 135–145)
Sodium: 136 mEq/L (ref 135–145)

## 2010-09-08 LAB — URINALYSIS, ROUTINE W REFLEX MICROSCOPIC
Leukocytes, UA: NEGATIVE
Nitrite: NEGATIVE
Specific Gravity, Urine: >1.03 — ABNORMAL HIGH (ref 1.005–1.030)
Urobilinogen, UA: 1 mg/dL (ref 0.0–1.0)
pH: 5.5 (ref 5.0–8.0)

## 2010-09-08 LAB — DIFFERENTIAL
Basophils Absolute: 0 10*3/uL (ref 0.0–0.1)
Eosinophils Absolute: 0 10*3/uL (ref 0.0–0.7)
Lymphocytes Relative: 13 % (ref 12–46)
Monocytes Relative: 9 % (ref 3–12)
Neutro Abs: 7 10*3/uL (ref 1.7–7.7)
Neutrophils Relative %: 78 % — ABNORMAL HIGH (ref 43–77)

## 2010-09-08 LAB — GLUCOSE, CAPILLARY
Glucose-Capillary: 102 mg/dL — ABNORMAL HIGH (ref 70–99)
Glucose-Capillary: 105 mg/dL — ABNORMAL HIGH (ref 70–99)

## 2010-09-08 LAB — URINE MICROSCOPIC-ADD ON

## 2010-09-08 LAB — RAPID URINE DRUG SCREEN, HOSP PERFORMED
Amphetamines: NOT DETECTED
Benzodiazepines: POSITIVE — AB
Opiates: NOT DETECTED
Tetrahydrocannabinol: NOT DETECTED

## 2010-09-16 ENCOUNTER — Encounter: Payer: Self-pay | Admitting: Orthopedic Surgery

## 2010-09-16 IMAGING — CR DG CHEST 1V PORT
1 series · 1 of 1 positions shown · non-contrast
Comparison: Chest 09/19/2009.

CLINICAL DATA: PICC placement.

PORTABLE CHEST - 1 VIEW

[view not recorded]
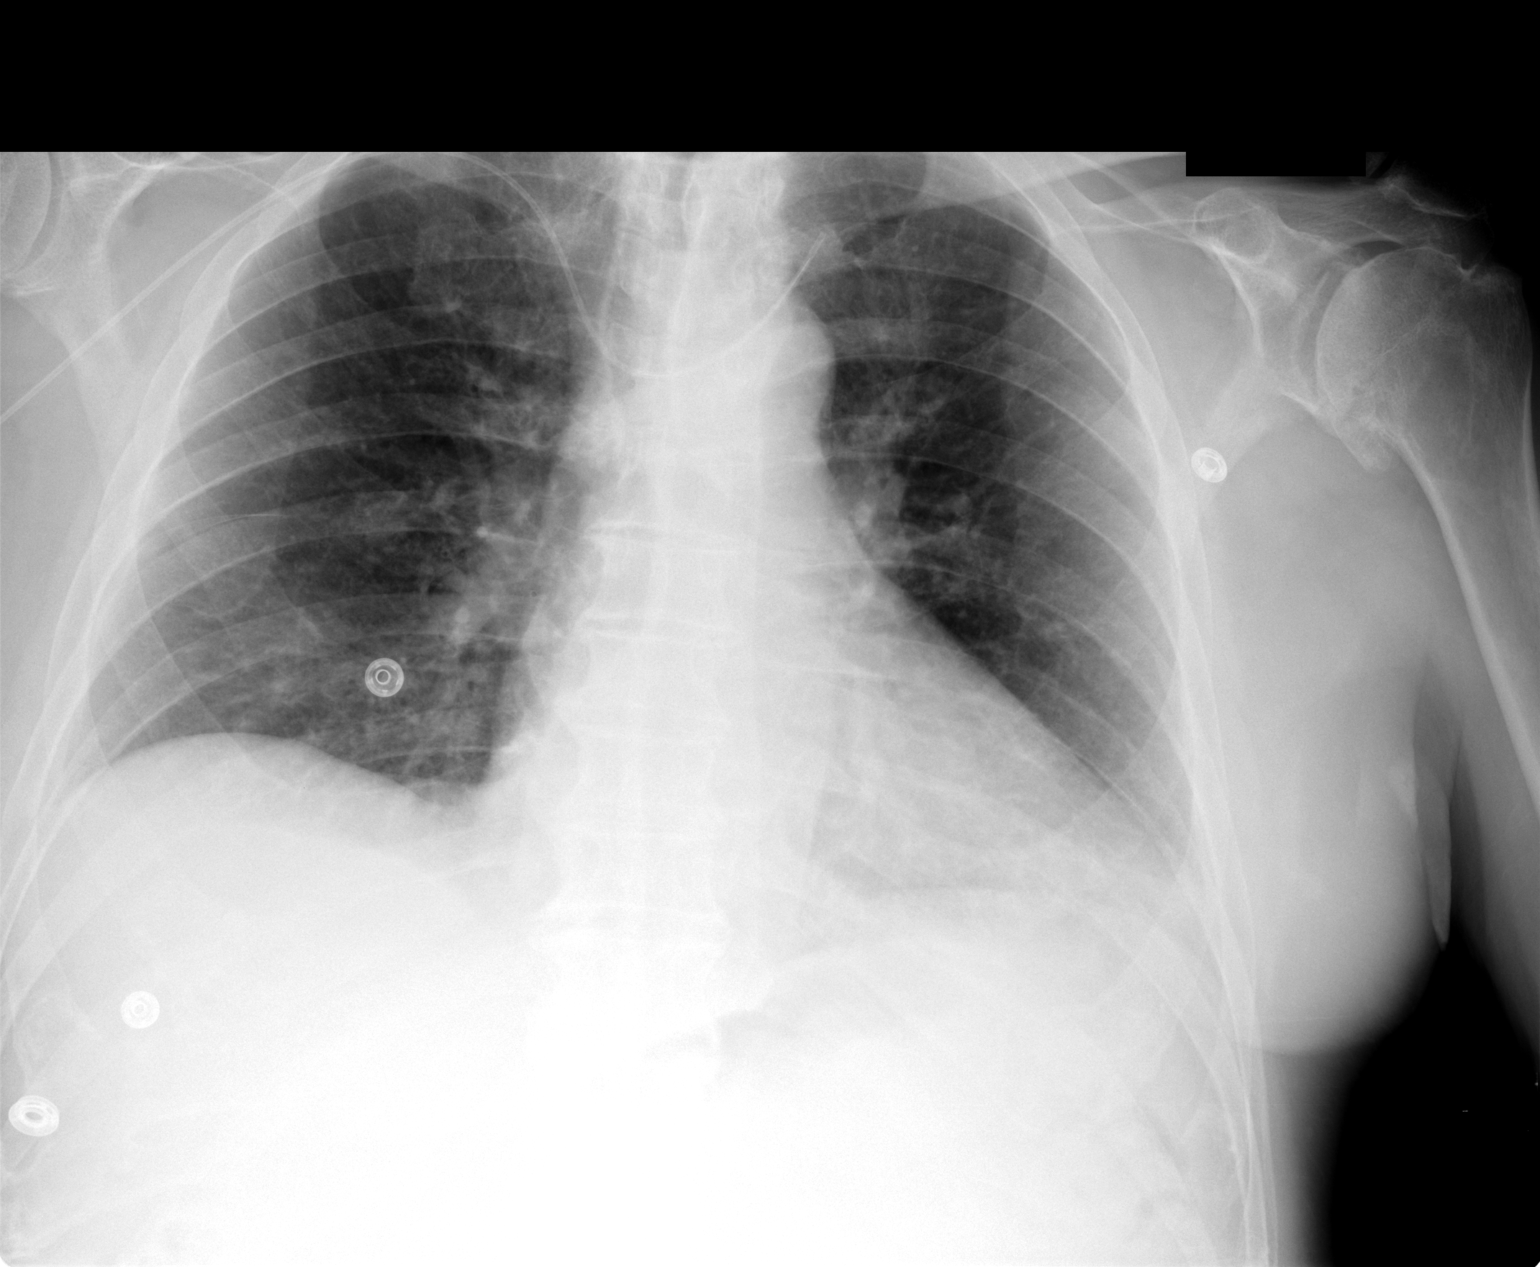

[1 of 1 positions shown; findings below may reference images not displayed]

FINDINGS: The patient has a new right side approach PICC in place.
The tip of the catheter is in the left brachiocephalic vein.  There
is no pneumothorax.  Left basilar atelectasis noted.
IMPRESSION: 1.  Right PICC has its tip in the left brachiocephalic vein.  No
pneumothorax.
2.  Left basilar subsegmental atelectasis.

## 2010-09-24 ENCOUNTER — Ambulatory Visit: Payer: Self-pay | Admitting: Orthopedic Surgery

## 2010-10-15 NOTE — Group Therapy Note (Signed)
NAMEMAMIE, DIIORIO NO.:  [id number]   MEDICAL RECORD NO.:  [id number]          PATIENT TYPE:  INP   LOCATION:  A203                          FACILITY:  [facility]   PHYSICIAN:  Mila Homer. Sudie Bailey, M.D.DATE OF BIRTH:  05/20/1930   DATE OF PROCEDURE:  DATE OF DISCHARGE:                                 PROGRESS NOTE   SUBJECTIVE:  He is feeling better today.   OBJECTIVE:  VITAL SIGNS:  He is supine in bed. Temperature 98.0, pulse  83, respiratory rate 20, blood pressure 134/79.  GENERAL:  He is in no acute distress. He appears to be alert.  LUNGS:  Today his lungs are clear throughout, moving air well.  HEART:  Regular rhythm with a rate of 80.  ABDOMEN:  Absolutely soft without hepatosplenomegaly, mass or  tenderness.  EXTREMITIES:  There is no edema in the ankles.   O2 sat today on room air is 97% and the potassium is 3.8, glucose 110,  albumin 2.7.   ASSESSMENT:  1. Acute ethyl alcoholism.  2. Chronic ethyl alcoholism.   PLAN:  Will discontinue his IV today. Get him up and moving. Will make  sure he is eating okay. Discharged tomorrow and he needs to start going  back to AA, which he belonged to for years. He understands.      Mila Homer. Sudie Bailey, M.D.  Electronically Signed     SDK/MEDQ  D:  10/21/2006  T:  10/21/2006  Job:  [id number]

## 2010-10-15 NOTE — H&P (Signed)
[name], [name]              ACCOUNT NO.:  [id number]   MEDICAL RECORD NO.:  [id number]          PATIENT TYPE:  INP   LOCATION:  IC05                          FACILITY:  [facility]   PHYSICIAN:  Kingsley Callander. Ouida Sills, MD       DATE OF BIRTH:  1928/02/17   DATE OF ADMISSION:  12/17/2008  DATE OF DISCHARGE:  LH                              HISTORY & PHYSICAL   CHIEF COMPLAINT:  Seeing people.   HISTORY OF PRESENT ILLNESS:  This patient is a 77 year old white male,  retired Visual merchandiser, who presented to the emergency room after seeing a man  on his porch.  He stated that the man was floating and had no legs.  The  patient has a history of heavy alcohol use, but has not consumed alcohol  for 5 days.  Prior to admission, he was evaluated in the emergency room,  where he was found to be tachycardic and hallucinating.  He was treated.  With an alcohol detox regimen.  He denied fever or pain.  He states he  has taken Librium intermittently over time.  His eating  has been  somewhat sporadic.  He lives by himself.   PAST MEDICAL HISTORY:  1. Appendectomy.  2. Tonsillectomy.  3. Chronic alcohol use.   MEDICATIONS:  Librium p.r.n.   ALLERGIES:  ASPIRIN.   SOCIAL HISTORY:  As above.   REVIEW OF SYSTEMS:  Noncontributory.   PHYSICAL EXAMINATION:  VITAL SIGNS:  Pulse initially 129, blood pressure  106/87, respirations 22, and temperature 96.9.  GENERAL:  Alert, talkative, and in no acute distress.  HEENT:  No scleral icterus.  Pharynx unremarkable.  NECK:  Supple with no JVD or thyromegaly.  LUNGS:  Clear.  HEART:  Regular with no murmurs.  ABDOMEN:  Nontender.  No hepatosplenomegaly.  EXTREMITIES:  No edema.  NEURO:  No focal weakness.  LYMPH NODES:  No enlargement.   LABORATORY DATA:  Sodium 131, potassium 3.0, bicarb 17, glucose 108, BUN  28, creatinine 1.67, SGOT 44, SGPT 17, albumin 4.3, alcohol level less  than 5, white count 9.0, hemoglobin 11.6 with MCV of 79.6, and platelets  196,000.   A drug screen is positive for benzodiazepines.   IMPRESSION:  1. Alcohol withdrawal.  He will be hospitalized and treated with      thiamine, multivitamins, magnesium, folate, and Ativan.  2. Hypokalemia.  He received oral potassium supplementation in the      emergency room and will be continued on IV fluids containing      potassium.  3. Probable mild alcoholic hepatitis.  He has a mild SGOT elevation.      Kingsley Callander. Ouida Sills, MD  Electronically Signed     ROF/MEDQ  D:  12/17/2008  T:  12/18/2008  Job:  [id number]

## 2010-10-15 NOTE — H&P (Signed)
NAMEFAYETTE, [name] NO.:  [id number]   MEDICAL RECORD NO.:  [id number]          PATIENT TYPE:  IPS   LOCATION:  0507                          [facility]ACILITY:  [facility]   PHYSICIAN:  Jasmine Pang, M.D. DATE OF BIRTH:  1928-01-16   DATE OF ADMISSION:  03/20/2008  DATE OF DISCHARGE:                       PSYCHIATRIC ADMISSION ASSESSMENT   IDENTIFYING INFORMATION:  This is a 77 year old widowed Caucasian male.  This is a voluntary admission.   HISTORY OF THE PRESENT ILLNESS:  This is one of several admissions for  this pleasant 77 year old gentleman with a history of alcohol abuse.  He  has a history of 20 years of sobriety which ended in the year following  his wife's death in Oct 12, 1997.  Since that time, he has been unable to string  together a significant amount of time with sobriety, has been working  with his primary care physician, Dr. [name] Giovanni, who currently has  him on Librium 25 mg t.i.d.  He says that he is suffering from  depression, recognizes that he is lonely, and feels that his loneliness  and isolation triggers his drinking episodes.  He has been drinking in  binge pattern, last drank 3 days ago, 2-1/2 fifths of wine.  He  presented in the emergency room requesting help with his depression  before he drank any more.  He says he has been drinking in binge pattern  about once a week to every 5 days and feels unable to stop.  He denies  active suicidal intent based on his religious faith but admits that he  feels so bad at times he does not want to do anything but just bury  himself in alcohol.  He also has endorsed decreased appetite when he is  drinking and is unable to sleep at night unless he is intoxicated.  Denies homicidal thoughts.  Denies confusion.  His primary support  person is a sister.   PAST PSYCHIATRIC HISTORY:  He is not under any current psychiatric care.  Reports he has made an appointment with mental health in [place],  [place], to see a counselor with [facility].  Last [facility] admission was in July of 2008.  He has also had several  admissions to our unit here in the past for alcohol detox and has also  had several medical admissions for alcohol detox.  He has been involved  in AA in the past to achieve abstinence for 20 years which ended in  1998-10-13.  He is reluctant to return to AA because he feels that the  discussion of alcohol sometimes prompts his desire for it.   SOCIAL HISTORY:  Widowed Caucasian male.  He has a sister who lives  locally.  Children live in [place].  Owns property in [place].  Lives alone in a house with several acres.  Is fairly close to  his neighbors who he talks to quite a bit.  Wife died after many years  of marriage in September of 1999 and the fall of the year is always  difficult for him.   FAMILY HISTORY:  He denies a family history of mental illness or  substance abuse.   MEDICAL HISTORY:   PRIMARY CARE PRACTITIONER:  Dr. [name] Giovanni, M.D.   MEDICAL PROBLEMS:  1. COPD.  2. Hiccups for 3 days.  3. Hearing deficit.  4. History of GI bleed.  5. Barrett esophagus.   CURRENT MEDICATIONS:  1. Librium 25 mg p.o. t.i.d.  2. Protonix 20 mg daily.   DRUG ALLERGIES:  ASPIRIN.   PHYSICAL EXAM:  Was done in the emergency room as noted in the record.  This is a fairly healthy-appearing, fully alert gentleman, 5 feet 7  inches tall, 166 pounds.  Temp 97.4.  Pulse 102.  Respirations 18.  Blood pressure 104/87.   DIAGNOSTIC STUDIES:  Were remarkable for urine drug screen positive for  benzodiazepines.  Alcohol level was less than 5.  Metabolic panel,  sodium 134, potassium 3.2, chloride 99, carbon dioxide 26, BUN 22,  creatinine 0.92.  liver enzymes, SGOT 19, SGPT 12, alkaline phosphatase  96, and total bilirubin 2.1.  CBC, WBC 7.2, hemoglobin 12.4, hematocrit  37.4, and platelets 219,000.   MENTAL STATUS EXAM:  Fully alert male,  pleasant, cooperative, in no  acute distress.  Speech normal.  Insight fairly good.  Cooperative, has  a sense of humor.  Admits to having a significantly depressed mood,  worse in the past month after going through the anniversary of his  wife's death.  Little family support.  His sister is his only support  person.  She brought him to the emergency room but he does not have a  lot of contact with her.  Does not have suicidal intent but passive  suicidal thoughts, having difficulty going forward without alcohol,  feels hopeless at times.  Cognition is fully preserved.  Insight  adequate.  Immediate, recent, remote memory are intact.  AXIS I:  1. Major depression, recurrent.  2. Alcohol abuse, rule out dependence.  AXIS II:  Deferred.  AXIS III:  1. Hiccups x3 days.  2. COPD.  3. Barrett esophagus.  4. History of GI bleed.  AXIS IV:  Severe issues with social isolation and protracted grief after  death of spouse.  AXIS V:  Current 47, past year 77.   ESTIMATED PLAN:  Is to voluntarily admit him to treat his depression.  We are also going to detox him from Librium and the alcohol.  We will  call Dr. Sudie Bailey with his permission and coordinate with him and he  reports he has never been treated with an antidepressant medication in  the past and we will consider this going forward as he makes some  progress with detox.     Margaret A. Lorin Picket, N.P.      Jasmine Pang, M.D.  Electronically Signed   MAS/MEDQ  D:  03/21/2008  T:  03/21/2008  Job:  [id number]

## 2010-10-15 NOTE — Discharge Summary (Signed)
NAMEKAINOAH, BARTOSIEWICZ              ACCOUNT NO.:  [id number]   MEDICAL RECORD NO.:  [id number]          PATIENT TYPE:  INP   LOCATION:  IC05                          FACILITY:  [facility]   PHYSICIAN:  Mila Homer. Sudie Bailey, M.D.DATE OF BIRTH:  11/11/1927   DATE OF ADMISSION:  12/17/2008  DATE OF DISCHARGE:  LH                               DISCHARGE SUMMARY   A 77 year old who was admitted to the hospital with alcohol withdrawal  syndrome.  Benign 4-day hospitalization extending from December 17, 2008, to  December 20, 2008.  Vital signs remained stable.   His admission alcohol level was less than 5.  CBC showed a hemoglobin of  11.6.  BMP:  A sodium 131, potassium 3.0, chloride 95, bicarb only 17,  BUN 28, creatinine 1.67 and glucose 108.  His bilirubin was 1.6,  indirect bilirubin 1.3, SGOT 44.  His urine drug screen was negative.   Admission UA showed a specific gravity greater than 1.030, moderate  bilirubin, 40 ketones, trace blood, 100 protein.  Microscopic 0-2 wbc's,  0-2 rbc's, rare bacteria.  Recheck BMET essentially normal.   He was admitted to the hospital and put on an alcohol withdrawal  protocol, including lorazepam, and also with treated IV magnesium.  He  gradually improved during this time came, became more cogent, and he  stopped having hallucinations.  He was seen by the ACT team and  recommendations were for him to have outpatient treatment at [facility].  He was medically stable the day of discharge.   FINAL DISCHARGE DIAGNOSES:  1. Alcohol withdrawal syndrome.  2. Recurrent ethyl alcoholism.  3. Electrolyte abnormalities including hyponatremia, hypokalemia.  4. Depression.   He is discharged home on lorazepam 1 mg t.i.d. maximum dose (30, no  refills).  Arrangements are being made for outpatient [facility] visit, and  followup will be in my office within 1 week.      Mila Homer. Sudie Bailey, M.D.  Electronically Signed     SDK/MEDQ  D:  12/20/2008  T:  12/20/2008  Job:   [id number]

## 2010-10-15 NOTE — Discharge Summary (Signed)
NAMEBRESLIN, HEMANN NO.:  [id number]   MEDICAL RECORD NO.:  [id number]          PATIENT TYPE:  IPS   LOCATION:  0300                          [facility]ILITY[facility]   PHYSICIAN:  Jasmine Pang, M.D. DATE OF BIRTH:  1928-05-18   DATE OF ADMISSION:  12/24/2006  DATE OF DISCHARGE:  12/28/2006                               DISCHARGE SUMMARY   IDENTIFYING INFORMATION:  [age]-year-old widowed white male who  was admitted on a voluntary basis on December 24, 2006.   HISTORY OF PRESENT ILLNESS:  The patient was transferred from [facility] where he was initially admitted for detox from alcohol.  His  alcohol level was 208.  He had been drinking fairly steadily for the  past 2 years.  He reports social isolation has been a stressor for him.  The longest the abstinence was 20 years.  From 1980 to 2000, ending with  his wife's death.  This is the fourth [facility] admission; the last time he  was here was for detox in 2004.  He has had 3 intervening admissions on  the medical unit for detox, with last on October 2007.  He has a  positive history of depressed mood with suicidal ideation.  He has COPD  and right knee pain.  He sees Dr. Sudie Bailey for primary care treatment.  He has had a history of DTs with detox.  He has had a history of  gastritis.  He is currently on Protonix 40 mg daily.  He has been on  Zoloft in the past.  He is on Librium 150 mg.  Ativan 1 mg q.i.d., but  none in the past 2-3 days prior to admission.  He is allergic to aspirin  (rash).   POSITIVE PHYSICAL FINDINGS:  A physical exam was done during his  hospitalization at [name].  There were no acute medical or  physical problems noted.  Alcohol level was 208.  UDS was positive for  benzodiazepines.  Chemistry panel was within normal limits.  Platelets  were slightly low at 188.  Liver panel was within normal limits.  Potassium was slightly low at 3.3.   HOSPITAL COURSE:  Upon admission,   the patient was given 50 mg of  Librium now, and 1 Percocet q.6 hours p.r.n. knee pain.  He was also  placed on a Librium detox protocol..  On December 24, 2006, he was placed on  Librium 50 mg p.r.n. insomnia x1.  The patient tolerated his medications  well with no significant side effects.  The patient initially stayed in  bed.  He was cooperative.  He complained of knee pain secondary to an  injury.  He was very positive about wanting detox from alcohol.  He had  some difficulties with tremors related to his alcohol detox.  He was  still having difficulty walking because of his knee pain.  He denied any  depressive symptoms or suicidal or homicidal ideation.Marland Kitchen  He continued to  show improvement through the hospitalization.  He had been going to unit  therapeutic groups and activities, but was still  complaining of craving  and anxiety during the day.  On December 28, 2006, the patient's mental  status had improved markedly from admission status.  He was friendly and  cooperative with good eye contact.  Speech was normal rate and flow.  Psychomotor activity was within normal limits.  Mood was euthymic.  Affect wide range.  No suicidal or homicidal ideation.  No thoughts of  self-injurious behavior.  No auditory or visual hallucinations.  No  paranoia or delusions.  Thoughts were logical and goal-directed.  Thought content no predominant theme.  Cognitive was grossly back to  baseline.  It was felt the patient was stable and safe to go home today.  He stated that he had a supportive family that he would be seeing, even  though he lives alone.   DISCHARGE DIAGNOSES:  AXIS I:  Alcohol dependence.  AXIS II:  None.  AXIS III:  Knee pain not otherwise specified.  Also chronic obstructive  pulmonary disease.  AXIS IV:  Severe (social isolation, problems with primary social group,  medical problems, burden of chemical dependence problem).  AXIS V:  Global assessment of functioning upon discharge was  50.  Global  assessment of functioning upon admission 26.  Global assessment of  functioning highest past year 62-68.   DISCHARGE PLANS:  There were no specific activity level or dietary  restrictions for this patient.   POST-HOSPITAL CARE PLANS:  Being arranged by our case manager, but were  not in the chart at this time.  The patient was discharged on no  medications.      Jasmine Pang, M.D.  Electronically Signed     BHS/MEDQ  D:  12/28/2006  T:  12/28/2006  Job:  [id number]

## 2010-10-15 NOTE — Group Therapy Note (Signed)
[name], [name]              ACCOUNT NO.:  [id number]   MEDICAL RECORD NO.:  [id number]          PATIENT TYPE:  INP   LOCATION:  A215                          FACILITY:  [facility]   PHYSICIAN:  Mila Homer. Sudie Bailey, M.D.DATE OF BIRTH:  Mar 16, 1928   DATE OF PROCEDURE:  DATE OF DISCHARGE:                                 PROGRESS NOTE   SUBJECTIVE:  Says he feels much better.   OBJECTIVE:  There is no longer the smell of alcohol in the patient.  He  is well-developed, well-nourished, appears to be oriented and alert.  No  acute distress.  Temperature 97.6, pulse 92, respiratory rate is 22, and the blood  pressure 169/87.  LUNGS:  Clear throughout.  HEART:  A regular rhythm. rate of 80.  ABDOMEN:  Soft without hepatosplenomegaly or mass or tenderness.  There  is no edema in the ankles.   MET-7 is normal except for mildly elevated glucose, 102, and O2 sat is  97% room air.   ASSESSMENT:  1. Acute and chronic ethyl alcoholism.  2. Local withdrawal syndrome.  3. Hypokalemia, which has cleared.  4. Hyperglycemia.  5. Anxiety.   PLAN:  We will DC IV, switch to Hep-Lock, get him up in a chair,  ambulate in the room and the hall.  Continue the Librium protocol.  Plan  to discharge home tomorrow if he continues to do well.      Mila Homer. Sudie Bailey, M.D.  Electronically Signed     SDK/MEDQ  D:  03/14/2007  T:  03/15/2007  Job:  [id number]

## 2010-10-15 NOTE — Group Therapy Note (Signed)
[name], [name]              ACCOUNT NO.:  [id number]   MEDICAL RECORD NO.:  [id number]          PATIENT TYPE:  INP   LOCATION:  A214                          FACILITY:  [facility]   PHYSICIAN:  Mila Homer. Sudie Bailey, M.D.DATE OF BIRTH:  06-24-1927   DATE OF PROCEDURE:  DATE OF DISCHARGE:                                 PROGRESS NOTE   SUBJECTIVE:  He feels a lot better today, actually he is hungry.  He  just had his EGD.   OBJECTIVE:  VITAL SIGNS:  Temperature 96.1, pulse 91, respiratory rate  22, blood pressure 138/74, O2 sat room air is 97%.  GENERAL:  Sitting up on a bed, feet on the floor.  Appears to be  oriented and alert.  No acute distress now.  He is well-developed, well-  nourished.  LUNGS:  Showed decreased breath sounds throughout, but he is moving air  well.  HEART:  A regular rate of 90.  ABDOMEN:  Soft without tenderness today.   Today his white cell count was 7200, hemoglobin and hematocrit 10.9 and  32.2, platelet count of 45,000 and potassium 3.2.   His EGD through Dr. Jena Gauss this morning showed a lot of inflammation in  distal esophagus.  There is a patch of Barrett's esophagus.   His abdominal CT done yesterday showed a circumferential distal  esophageal wall thickening with associated paraesophageal  lymphadenopathy, highly suspicious for esophageal cancer.   ASSESSMENT:  1. Severe reflux esophagitis and Barrett's esophagus.  2. Question esophageal cancer.  3. Long history of ethyl alcoholism.   PLAN:  1. He will be on Proton pump inhibitors and Carafate today.  2. Resume regular diet.  3. The path report of the biopsies of the esophagus are pending.  When      need to continue replenish his potassium 4.  Hypokalemia.     Mila Homer. Sudie Bailey, M.D.  Electronically Signed    SDK/MEDQ  D:  05/01/2007  T:  05/01/2007  Job:  [id number]

## 2010-10-15 NOTE — H&P (Signed)
NAMETAVARI, LOADHOLT NO.:  [id number]   MEDICAL RECORD NO.:  [id number]          PATIENT TYPE:  INP   LOCATION:  A203                          FACILITY:  [facility]   PHYSICIAN:  Mila Homer. Sudie Bailey, M.D.DATE OF BIRTH:  05/20/1930   DATE OF ADMISSION:  10/19/2006  DATE OF DISCHARGE:  LH                              HISTORY & PHYSICAL   A 77 year old who presented to the ER last night with symptoms of visual  and auditory hallucinations. Apparently he had been drinking 4 or 5  pints of white lightening a day.   He has a long history of ethyl alcoholism, anxiety and depression.   He lives by himself. He is currently on lorazepam at home for anxiety.   The family was with him on admission and said he was not eating. He also  had loss of bowel and bladder function.   ADMISSION PHYSICAL EXAM:  GENERAL:  A disheveled, elderly man with blood  pressure 140/96, pulse 121, respiratory rate 20, temperature 98.8.  GENERAL:  He was combative having hallucinations, somewhat withdrawn. He  was tearful . He was alert and he was oriented.  HEENT:  Mucous membranes were moist.  SKIN:  Skin turgor was normal. He was tremulous however.  HEART:  His heart had a regular rhythm, rate of 80.  LUNGS:  Clear, moving air well, with decreased breath sounds throughout.  ABDOMEN:  Soft without hepatosplenomegaly, mass or tenderness.  EXTREMITIES:  There was edema of the ankles.   His admission blood work showed a white cell count of 5800, 57  neutrophils, 31 lymphs. H&H was 13.3 and 37.5. Potassium was 3.0. His  drug screen was positive for benzodiazepines and alcohol level was 302.   ASSESSMENT:  1. Acute ethyl alcoholism.  2. Chronic ethyl alcoholism.  3. Hypokalemia. Currently he is on IV D5 normal saline at 75 mL an      hour and DT protocol with Librium. Will add Kay Ciel 20 mEq daily      and recheck his potassium in the morning.      Mila Homer. Sudie Bailey, M.D.  Electronically  Signed    SDK/MEDQ  D:  10/20/2006  T:  10/20/2006  Job:  [id number]

## 2010-10-15 NOTE — Group Therapy Note (Signed)
[name], [name]              ACCOUNT NO.:  [id number]   MEDICAL RECORD NO.:  [id number]          PATIENT TYPE:  INP   LOCATION:  IC05                          FACILITY:  [facility]   PHYSICIAN:  Mila Homer. Sudie Bailey, M.D.DATE OF BIRTH:  Feb 06, 1928   DATE OF PROCEDURE:  12/18/2008  DATE OF DISCHARGE:                                 PROGRESS NOTE   SUBJECTIVE:  The patient is admitted to the hospital with PT yesterday.  He is able to say a few things today, but he sounds disoriented.  He is  talking about his daughter last when he came, why he came here, he said  his daughter had sudden pain in her back for a pinched nerve that went  on for a bit, but did not try anything together as to why he was here.   OBJECTIVE:  VITAL SIGNS:  Temperature is 97.6, pulse 81, respiratory  rate 20, blood pressure 129/69.  GENERAL:  Speech is normal, but he is somewhat disoriented.  He does  know he is at [facility].  HEART:  Regular rhythm rate of 18.  LUNGS:  Clear throughout.  ABDOMEN:  Soft.  EXTREMITIES:  There is no edema of the ankles.   O2 sats 100% and 98% on room air.  BMP is normal.   ASSESSMENT:  1. Alcohol withdrawal syndrome.  2. Hypokalemia.  3. Alcoholic hepatitis.   PLAN:  He is currently on lorazepam by protocol, multivitamins, KCL,  thiamine, folic acid, and Lovenox.  Continue this regimen.      Mila Homer. Sudie Bailey, M.D.  Electronically Signed     SDK/MEDQ  D:  12/18/2008  T:  12/18/2008  Job:  [id number]

## 2010-10-15 NOTE — Op Note (Signed)
NAMEDONAT, HUMBLE              ACCOUNT NO.:  [id number]   MEDICAL RECORD NO.:  [id number]          PATIENT TYPE:  AMB   LOCATION:  DAY                           FACILITY:  [facility]   PHYSICIAN:  R. Roetta Sessions, M.D. DATE OF BIRTH:  March 15, 1928   DATE OF PROCEDURE:  05/13/2007  DATE OF DISCHARGE:                                PROCEDURE NOTE   PROCEDURE DONE:  Colonoscopy with ablation biopsy, snare polypectomy.   INDICATIONS FOR PROCEDURE:  A 77 year old gentleman admitted to hospital  recently with GI bleeding.  EGD demonstrated severe ulcerative reflux  esophagitis, question of a mass in the distal esophagus.  Biopsies  revealed atypia.  Also he had a 1.3 cm lymph node adjacent to the  esophagus on CT scan.  He is scheduled for an outpatient EUS.  On the  same CT which he had done a couple weeks ago,  Charyl Dancer, MD  raised concern of the polypoid lesion in the area of the sigmoid colon.  He has never his lower GI tract evaluated.  Colonoscopy is now being  done.  This procedure has been discussed with the patient at length.  Potential risks, benefits and alternatives have been reviewed, questions  answered.  Please see documentation in the medical record.   PROCEDURE NOTE:  O2 saturation, blood pressure, pulse and respirations  monitored throughout the entire procedure.  Conscious sedation Versed 7  mg IV, Demerol 100 mg IV in divided doses.  Instrument Pentax video chip  system.   FINDINGS:  Digital rectal exam revealed no abnormalities.  The prep was  adequate.  Colon:  Colonic mucosa was surveyed from rectosigmoid  junction through the left transverse right colon to the appendiceal  orifice, ileocecal valve and cecum.  These structures were well seen,  photographed for the record.  From this level scope was withdrawn.  All  previous mentioned mucosal surfaces were again seen.  The patient had  multiple diminutive polyps in the cecum which were cold  biopsied/removed.  There were a couple of pedunculated polyps in the  ascending colon which were cold and hot snared.  There were left-sided  transverse diverticula.   In the mid sigmoid colon there was a 1.5-cm angry pedunculated polypoid  lesion on a long stalk.  Please see photos.  This was removed completely  with two passes of the snare and both polyp fragments were recovered for  the pathology.  Remainder of colon mucosa appeared normal.  Scope was  pulled down in the rectum where there was some diminutive polyps noted  in the rectosigmoid junction.  There were ablated with the tip of snare  cautery unit.  A thorough examination of the rectal mucosa including  retroflex view of the anal verge demonstrated no abnormalities.  The  patient tolerated the procedure well and was reactive after endoscopy.   IMPRESSION:  1. Diminutive rectosigmoid polyps ablated with the tip of the snare      cautery, otherwise normal rectum.  2. A large pedunculated polyp at the mid sigmoid (30 cm) resected      completely with  snare cautery.  3. Left-sided transverse diverticula.  4. Polyps about the ascending colon and cecum removed with cold biopsy      and/or hot or cold snare technique.  5. The remainder of the colonic mucosa appeared normal.   RECOMMENDATIONS:  1. Follow-up on path.  2. Diverticulosis literature provided to Mr. Bonenberger.  3. He should follow through with plans to further evaluate his      esophagus and the adjacent lymph node via endoscopic ultrasound by      Dr. Wendall Papa down in [place] in  the very near future.  4. Further recommendations to follow.      Jonathon Bellows, M.D.  Electronically Signed     RMR/MEDQ  D:  05/13/2007  T:  05/14/2007  Job:  [id number]   cc:   Charyl Dancer, MD  Fax: [phone]   Mila Homer. Sudie Bailey, M.D.  Fax: [phone]

## 2010-10-15 NOTE — Group Therapy Note (Signed)
[name], [name]              ACCOUNT NO.:  [id number]   MEDICAL RECORD NO.:  [id number]          PATIENT TYPE:  INP   LOCATION:  A214                          FACILITY:  [facility]   PHYSICIAN:  Edward L. Juanetta Gosling, M.D.DATE OF BIRTH:  1927/10/27   DATE OF PROCEDURE:  05/02/2007  DATE OF DISCHARGE:                                 PROGRESS NOTE   This is a patient of Dr. Sudie Bailey.  [name] says he feels much  better.  He wants to get up and move around which I think is  appropriate.  He has no new complaints.  He has not noticed any more  bleeding.   He had an EGD with biopsy done yesterday. He is on twice-a-day proton  pump inhibitor.  Diet has been advanced.  I think he is doing fairly  well.  I would like to see him get up and move around.   His exam today shows a temperature of 98.5, pulse 84, respirations 20,  blood pressure 141/80, O2 saturation 98% on room air. His hemoglobin  level 10.7. Potassium 3.14, so we will need to replace his potassium.  Hemoglobin was 10.9 yesterday.  I am going to repeat all that tomorrow.  If he is stable, I think he will probably be able to go home, but that  will, of course, be left to Dr. Sudie Bailey.      Edward L. Juanetta Gosling, M.D.  Electronically Signed     ELH/MEDQ  D:  05/02/2007  T:  05/02/2007  Job:  [id number]

## 2010-10-15 NOTE — H&P (Signed)
[name], [name]              ACCOUNT NO.:  [id number]   MEDICAL RECORD NO.:  [id number]          PATIENT TYPE:  INP   LOCATION:  A215                          FACILITY:  [facility]   PHYSICIAN:  Mila Homer. Sudie Bailey, M.D.DATE OF BIRTH:  [birth year]   DATE OF ADMISSION:  03/13/2007  DATE OF DISCHARGE:  LH                              HISTORY & PHYSICAL   This 77 year old presents at the hospital complaining of nausea,  vomiting, and tremulousness.  He started drinking a week ago drinking as  much as 2/5 a day.  Stopped today.  He thought he may have had a visual  hallucination as well.   He has had recurrent binges of ethyl alcoholism over the last year or  two with at least 3-5 hospital admissions during that time.   Currently lives by himself.  Is on aspirin a day.  His elderly mother  died a few years ago.   He is a former smoker, heavy drinker.  No drug abuse.   PHYSICAL EXAMINATION:  GENERAL:  Shows a pleasant elderly man.  VITAL SIGNS:  Blood pressure 155/83, pulse 108, respiratory rate 36,  temperature 97.6.  GENERAL:  He is somewhat recumbent in bed.  He is in no acute distress,  well-developed, well-nourished.  Appears to be oriented and alert.  HEENT:  Mucous membranes appear moist.  SKIN:  Skin turgor is normal.  HEART:  Regular rhythm and rate of about 90.  LUNGS:  Appear clear throughout.  ABDOMEN:  Soft without hepatosplenomegaly or mass or tenderness.  EXTREMITIES:  There is no edema of the ankles.   Ethyl alcohol level is 92.  His CBC is normal.  CMP shows sodium 134,  potassium 3.2, bicarb 18, glucose 176.  Bilirubin 2.   ASSESSMENT:  1. Acute and chronic ethyl alcoholism.  2. Alcohol withdrawal syndrome.  3. Hypokalemia.  4. Anxiety.  5. Hyperbilirubinemia.  6. Hyperglycemia.   He will be admitted to the hospital on IV normal saline at 125 mL an  hour.  Librium protocol.  Replace his potassium with KCl 20 mEq b.i.d.  Recheck MET 7 in the  morning.      Mila Homer. Sudie Bailey, M.D.  Electronically Signed     SDK/MEDQ  D:  03/13/2007  T:  03/13/2007  Job:  [id number]

## 2010-10-15 NOTE — H&P (Signed)
NAMECLELL, TRAHAN              ACCOUNT NO.:  [id number]   MEDICAL RECORD NO.:  [id number]          PATIENT TYPE:  INP   LOCATION:  A214                          FACILITY:  [facility]   PHYSICIAN:  Mila Homer. Sudie Bailey, M.D.DATE OF BIRTH:  [birth year]   DATE OF ADMISSION:  04/30/2007  DATE OF DISCHARGE:  LH                              HISTORY & PHYSICAL   This 77 year old came to the hospital today, having been vomiting blood  for a couple of days.   He has a long history of ethyl alcoholism; in fact, stopped drinking  about 2 days ago by his admission.  He has been admitted to this  hospital several times for acute ethyl alcoholism and alcohol withdrawal  syndrome.   He suppose to be on chlordiazepoxide 25 mg t.i.d., which is the only  drug that really helps him with nervous anxiety.  (Other drugs have been  tried.)  Currently lives by himself.   The patient gives no history of dysphasia.  Denies having vomited blood  before.  He may have had some black tarry stools.  There has really been  no abdominal pain.  He did have an EGD through Dr. Dionicia Abler over 2 years  ago.   PHYSICAL EXAMINATION:  VITAL SIGNS: Temperature 99.2, pulse 109,  respiratory rate 20, blood pressure 137/76, O2 sat 97% room air.  He  weighs 84 kg and he is 70-1/2 inches high.  GENERAL:  Physical exam showed a pleasant elderly man who was supine in  bed at the time I examined him.  He seemed to be well developed, well  nourished, and alert.  Sentence structure is intact.  History seemed to  be good.  His sensorium appeared to be normal.  HEENT:  Color was good.  He had a small laceration on the bridge of the  nose apparently, from his dog.  LYMPHATIC:  There is no axillary or supraclavicular adenopathy.  Negative anterior cervical nodes.  LUNGS:  Clear throughout.  Moving air well, without intercostal  retraction, without use accessory muscles of respiration.  HEART:  Regular rhythm, rate of about 70.  ABDOMEN:   Soft, without hepatosplenomegaly or mass.  EXTREMITIES:  There is no edema of the ankles.   His white cell count is 19,100 of which 89% are neutrophils, 7% lymphs.  H&H 14.4/43.3.  Cardiac markers negative.  Ethanol level less than 5.  Total bilirubin 2.2, potassium 2.6, chloride 88, glucose 137.  Drug  screen was negative except for benzodiazepines.  Urine just showed some  granular casts, and 0-2 WBCs and 0-2 RBCs per HPF.   The patient had a CT scan that suggested possible esophageal carcinoma.   ADMISSION DIAGNOSES:  1. Gastrointestinal bleeding, which could be secondary to esophageal      carcinoma, peptic ulcer disease, or esophageal varices.  2. Chronic ethyl alcoholism.  3. Hypokalemia.  4. Dehydration.  5. Depression.  6. Hyperbilirubinemia.   Currently he is on IV fluids.  He has already had potassium in the ER  and is getting more runs of potassium now.  He will be on clear  liquids  this evening.  I have discussed this case with Dr. Greer Pickerel,  gastroenterologist, and the patient will probably have an EGD tomorrow.      Mila Homer. Sudie Bailey, M.D.  Electronically Signed     SDK/MEDQ  D:  04/30/2007  T:  04/30/2007  Job:  [id number]

## 2010-10-15 NOTE — Discharge Summary (Signed)
NAMESERAFINO, BURCIAGA              ACCOUNT NO.:  [id number]   MEDICAL RECORD NO.:  [id number]          PATIENT TYPE:  INP   LOCATION:  A213                          FACILITY:  [facility]   PHYSICIAN:  Mila Homer. Sudie Bailey, M.D.DATE OF BIRTH:  1927-06-25   DATE OF ADMISSION:  12/23/2006  DATE OF DISCHARGE:  07/24/2008LH                               DISCHARGE SUMMARY   HISTORY OF PRESENT ILLNESS:  This 77 year old was admitted to the hospital with acute alcohol  intoxication.  He had a two day hospitalization extending to July 23-24,  2008.  Vital signs remained stable.   On admission , H&H 12.5/36.9.  Potassium 3.4, BUN 20, creatinine 0.86.  Urine drug screen was positive benzodiazepines.  Alcohol level was 208.   He was put on a chlordiazepoxide protocol for alcohol withdrawal.  He  was also put on potassium chloride 20 mg b.i.d. given IV normal saline  at 100 ml an hour.  He was doing well second day was ready for transfer  to Behavioral Health at that time.   FINAL DISCHARGE DIAGNOSES:  1. Acute alcohol intoxication.  2. Hypokalemia.  3. Depression.  4. Mild dehydration.   FOLLOWUP:  Follow-up will be in the office when he is done with the  inpatient Behavioral Health.      Mila Homer. Sudie Bailey, M.D.  Electronically Signed     SDK/MEDQ  D:  12/24/2006  T:  12/24/2006  Job:  [id number]

## 2010-10-15 NOTE — Consult Note (Signed)
[name], [name]              ACCOUNT NO.:  [id number]   MEDICAL RECORD NO.:  [id number]          PATIENT TYPE:  INP   LOCATION:  A214                          FACILITY:  [facility]   PHYSICIAN:  R. Roetta Sessions, M.D. DATE OF BIRTH:  01/21/1928   DATE OF CONSULTATION:  04/30/2007  DATE OF DISCHARGE:                                 CONSULTATION   REASON FOR CONSULTATION:  Apparent upper GI bleed.   HISTORY OF PRESENT ILLNESS:  [name] is a very pleasant 77-  year-old Caucasian male alcoholic, resident of [facility],  followed primarily with Dr. Gareth Morgan who came to the emergency  department this afternoon after vomiting blood.  He has had some black  stools over the past couple of days as well.  He presented to Dr.  Margretta Ditty where evaluation ensued.  He has remained hemodynamically  stable thus far.  Admission laboratory data revealed [place]  [address], hemoglobin 14.4, hematocrit 43.3, MCV 85.6 and platelet count  280,000.   He reports some vague periumbilical pain earlier today but that has  resolved.  He has a long history of ulcerative reflux esophagitis last  documented on 2004 EGD by Dr. Karilyn Cota.  He has not had any dysphagia,  odynophagia, early satiety or hematochezia.  He tells me his appetite is  well maintained.  He drinks two bottles of wine daily as his sole source  of alcohol.   Additional labs indicated a potassium of 2.6.  Bilirubin was slightly  elevated at 2.2, fractionated indirect 1.8, direct 0.4.  Other LFTs  normal (see below).  His lipase was normal at 27.   He also underwent a CT scan in the emergency department of the abdomen  and pelvis which I reviewed with Dr. Holley Dexter.  Aside from a  little fatty liver his liver looked good.  It was not cirrhotic.  The  spleen was not enlarged.  There was evidence of portal hypertension  however.  His distal esophagus was circumferentially thickened and there  was a 1.3 cm node in  this area.  There was also a question of a polypoid  lesion in his colon (This may not be on the official report.  Reviewed  with Dr. Maricela Curet image 7).   [name] has never undergone a colonoscopy and noted that his mother  was diagnosed with colon cancer when she was 75 years old.  [name]  denies fever or chills.  The only reason to the emergency room was  because he vomited blood.   PAST MEDICAL HISTORY:  1. Longstanding ulcerative reflux esophagitis.  2. Longstanding alcoholism with multiple psychiatric admissions for      same.  3. History of anxiety.  4. Status post appendectomy and tonsillectomy.  5. He has not smoked any cigarettes since his 75s.  6. Longstanding heavy alcohol abuse but no illicit drug use.   MEDICATIONS:  On admission Librium 25 mg three times daily   ALLERGIES:  ASPIRIN CAUSES UPSET STOMACH.   SOCIAL HISTORY:  The patient is widowed and has three sons.  He was  former [employer].  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   FAMILY HISTORY:  Both mother and father deceased.  Mother had colon  cancer but died of advanced Alzheimer's.  Father had leukemia.   REVIEW OF SYSTEMS:  As per the history of present illness he has not had  any recent chest pain or dyspnea on exertion.  He has not had any  hemoptysis.  All he has had is hematemesis and melena.  Vague abdominal  pain recently but that is resolved.   PHYSICAL EXAMINATION:  GENERAL:  Reveals a somewhat disheveled elderly  gentleman who is quite alert, conversant and pleasant.  VITAL SIGNS:  Weight is 84 kg, height is 70.5 inches.  HEENT:  There is no scleral icterus.  He has a laceration on his nose  from (he said his puppy jumped on him).  Conjunctivae were slightly  pink.  CHEST:  Lungs are clear to auscultation.  HEART:  Regular rate and rhythm without murmur, gallop or rub.  ABDOMEN:   Nondistended.  Positive bowel sounds.  Soft.  Entirely  nontender to deep palpation.  No appreciable mass or hepatosplenomegaly.  EXTREMITIES:  No edema distally.   LABORATORY DATA:  A urinalysis negative except for protein 30 mg/dL,  ketones 15.  Sodium 137, potassium 2.6, chloride 88, CO2 33, glucose  137, BUN 22, creatinine 1.24.  AST 32, ALT 20, total protein 7.3,  albumin 4.0, calcium 8.6.  Magnesium level 2.4.   IMPRESSION:  [name] is actually a very pleasant 77 year old  Caucasian male alcoholic admitted to the hospital with hematemesis and  melena.  He appears to be quite stable at this time.  He is profoundly  hypokalemic and that is being addressed.  He had some transient  abdominal pain etiology of which is not well defined.  He has not had  any chronic abdominal pain.  CT findings as described above.  He is  marked thickening of his distal esophagus which certainly could be a  manifestation of severe uncontrolled ulcerative reflux esophagitis.  Underlying tumor is not excluded.  He did have one pathologically  enlarged lymph node.  However, it is good to see that his liver and  spleen aside from some fatty infiltration looked good.  There is no  evidence of pancreatitis or other intra-abdominal process, however,  there is suggestion of a polypoid lesion in the sigmoid colon on CT and  we have noted that he has never had a colonoscopy and has a positive  family history of colon cancer in a first degree relative.   RECOMMENDATIONS:  1. Will agree with repleting his potassium following his hemoglobin      and hematocrit.  2. He has already been started on IV PPI therapy in the way of      Protonix 40 mg q.24 h.  3. He has also been started on Librium.  He is certainly at risk for      DTs  4. Note that he has been started also on Tylenol 650 mg p.o. q.4 h.      p.r.n. pain and fever.  We want to limit his acetaminophen to no      more than 2 grams daily.  5. I have  offered [name] a diagnostic EGD tomorrow morning.      Potential risks, benefits and alternatives have been reviewed.  His  questions were answered.  He is agreeable.  At some point as an      outpatient I have also strongly recommended he return for a      colonoscopy.   Depending on what his esophagus looks like, we may actually set him up  for an endoscopic ultrasound to look further at the mucosa, submucosa  and structures surrounding the esophagus depending on what is found at  endoscopy tomorrow.  Will make further recommendations once EGD has been  performed.   I would like to thank Dr. Gareth Morgan for allowing me to see this  nice gentleman this evening.      Jonathon Bellows, M.D.  Electronically Signed     RMR/MEDQ  D:  04/30/2007  T:  04/30/2007  Job:  [id number]   cc:   Mila Homer. Sudie Bailey, M.D.  Fax: [phone]

## 2010-10-15 NOTE — Discharge Summary (Signed)
[name], [name] NO.:  [id number]   MEDICAL RECORD NO.:  [id number]          PATIENT TYPE:  INP   LOCATION:  A203                          FACILITY:  [facility]   PHYSICIAN:  Mila Homer. Sudie Bailey, M.D.DATE OF BIRTH:  05/20/1930   DATE OF ADMISSION:  10/19/2006  DATE OF DISCHARGE:  LH                               DISCHARGE SUMMARY   DISCHARGE SUMMARY   DISCUSSION:  This 77 year old was admitted to the hospital with acute  ethyl alcoholism.  He had a benign 3-day hospitalization that extended  from 05/28 to 10/22/2006.  Vital signs remained stable.  Initial white  cell count is 5800, and H&H is 13.8 and 37.5, platelet count 216,000.  CMP showed potassium 3.0, glucose 127, SGOT 54, albumin 3.2.  Alcohol  level was .302, and urine drug screen was positive for benzodiazepines.  Repeat LFT showed an albumin of 2.7, and recheck BMET showed potassium  3.8, glucose 110, BUN 4, creatinine 0.87.   He was admitted to the hospital with diagnosis of ETOH intoxication and  withdrawal symptoms, put on a regular diet and IV of D5 normal saline,  75 mL an hour, with DT protocol with Librium.  Kay Ciel 20 mEq daily was  added when his low potassium was discovered.  By his third day, his IV  was DC'd.  He switched to Hep-Lock and he was to be up in a chair and  ambulating, which he did.   DISCHARGE PLANNING:  Checked into AA meetings for him, and that was  arranged before discharge so that by his final fourth day of  hospitalization, 10/22/2006, he could be discharged home.  At that time,  he had a prescription for Chlordiazepoxide at 25 mg to use q.i.d. for  nervousness (no refills).  He was to abstain from alcohol, to go AA  meetings starting the night of discharge, and follow up in the office in  5 days.   FINAL DISCHARGE DIAGNOSES:  1. Acute alcohol intoxication.  2. Chronic ethyl alcoholism.  3. Hypokalemia.      Mila Homer. Sudie Bailey, M.D.  Electronically  Signed     SDK/MEDQ  D:  10/22/2006  T:  10/22/2006  Job:  [id number]

## 2010-10-15 NOTE — H&P (Signed)
NAMEKEYGAN, DUMOND NO.:  [id number]   MEDICAL RECORD NO.:  [id number]          PATIENT TYPE:  IPS   LOCATION:  0304                          FACILITY[facility]  [facility]   PHYSICIAN:  Jasmine Pang, M.D. DATE OF BIRTH:  July 05, 1927   DATE OF ADMISSION:  12/24/2006  DATE OF DISCHARGE:                       PSYCHIATRIC ADMISSION ASSESSMENT   IDENTIFYING INFORMATION:  This is a 77 year old white male who is  widowed.  This is a voluntary admission.   HISTORY OF PRESENT ILLNESS:  This is one of several admissions for this  pleasant 77 year old white male who was transferred from [facility] where he was initially admitted on July 23rd with an alcohol  level of 208.  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  He  knew that he could not get through the day unless he went and got  another supply of alcohol to drink.  Says that he has been drinking  between 2 and 3 fifths a day.  He would like to be off of it.  Feels  that he can not go on like this.  Fears that drinking will kill him.  He  denies any depressed mood or suicidal ideation.  He says he has been  drinking pretty steadily for the last couple of years.  His longest  period of abstinence is approximately 20 years from 1980-2000, ending  around the time his wife was deceased.  He has a history of suicidal  ideation but denies any at that at this time.  No hallucinations.  No  seizures.   PAST PSYCHIATRIC HISTORY:  This is the patient's fourth admission to  [facility], with his last admission here in  2004 for detox.  He has had at least 3 intervening medical admissions  for alcohol detox with his last one in October 2007.  He had several  admissions to [facility] in 2004, and at that time had expressed suicidal  ideation with plans to shoot himself, but no history of known suicide  attempts.  He, at one point,  was treated with Zoloft for depression.  Reports that he did not take it for any significant length of time.   SOCIAL HISTORY:  This is a widowed white male [place]  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  He has some  supportive relatives that live nearby.  He is widowed.  No legal  problems.  Does have some significant social isolation.   FAMILY HISTORY:  Not available.   ALCOHOL AND DRUG HISTORY:  He has a history of alcohol abuse.  No known  history of other substance abuse.   MEDICAL HISTORY:  The patient is followed by Dr. Sudie Bailey, MD in  [place], [place], his primary care physician.   MEDICAL PROBLEMS:  Acute right knee pain..  Onset on or about the time  immediately prior to admission, July 22 or July 23, and COPD.  Right  knee x-ray  is negative.   PAST MEDICAL HISTORY:  Remarkable for history of DTs in the past with  detox, with a lot of visual hallucinations and some confusion.  He does  also have a past history of gastritis with alcohol intake.   CURRENT MEDICATIONS:  Protonix 40 mg daily.  He had been prescribed  Ativan 1 mg q.i.d. by his primary care physician, but had had none in 2-  3 days prior to admission.  At [facility], he received 150 mg of Librium  July 24, prior to admission.   ALLERGIES:  ASPIRIN which causes a rash.   POSITIVE PHYSICAL FINDINGS:  A tall, thin gentleman who is quite  disheveled in a hospital gown, complaining of right knee pain whenever  he stands up.  Having quite a bit of difficulty with ambulation and is a  bit unsteady on his feet.  CIWA approximately 5.  Vital signs:  temperature 97.2, heart rate 87, respirations 18, blood pressure 153/79.  No asterixis.  Motor is symmetrical.  Minimal fine motor tremor of his  upper extremities which is bilaterally symmetrical.  Extraocular  movements are intact.  Gait is impaired by the knee pain, but his  balance is intact.  Neuro otherwise intact.   Knee exam:  He has 2+  swelling along the medial side of the right knee. Drawer is negative.  Gait is stable.  No clicking or crepitation noted, and x-rays are  negative.  Skin is intact.  The patient recalls no trauma.  Full  physical exam was done in the emergency room at the time of admission.   DIAGNOSTIC STUDIES:  Alcohol level 208 at the time of admission on July  23.  Urine drug screen positive for benzodiazepines.  Chemistries are  intact.  Platelets 188.  Liver within normal limits, and potassium 3.3.   MENTAL STATUS EXAM:  Fully alert male, pleasant, cooperative, although  he is disheveled and complaining of some right knee pain.  He is fully  engaged in conversation.  Feels that he has been successfully detoxed  and has no longer any need to be here, is anxious to get home.  Mood is  euthymic.  Thought process is logical and coherent.  No evidence of  suicidal or homicidal thought.  Insight is a bit limited, and he does  not seem to have a clear plan for how he is going to stay sober after he  leaves here other the other than to just quit drinking.  Cognition is  completely intact.  Impulse control is within normal limits.  Calculation and concentration are intact.   AXIS I:  EtOH abuse and dependence.  AXIS II:  Deferred.  AXIS III:  Knee pain not otherwise specified, chronic obstructive  pulmonary disease by history.  History of gastritis.  AXIS IV:  Severe social isolation.  AXIS V:  Current 26, past year 62-68.   PLAN:  Voluntarily admit the patient with the goal of a safe detox in 4  days.  We have started him on a Librium protocol which is a 25 mg q.i.d.  and 25 mg q.2 hours p.r.n. for withdrawal.  We are going to put a call  bell within reach for safety, and we will have someone assist him with  his ADLs, and we are will plan on getting him a follow-up appointment  with orthopedics in [place] to evaluate his knee.  Meanwhile, we are  going to allow him to use  either a walker  or a cane while here.  We have  asked him to think about considering medications to help him stay off  alcohol, including Campral or possibly Antabuse, and possibly letting  some of his extended family participate in a family session.  Estimated  length of stay is 5 days.      Margaret A. Lorin Picket, N.P.      Jasmine Pang, M.D.  Electronically Signed    MAS/MEDQ  D:  12/25/2006  T:  12/26/2006  Job:  [id number]

## 2010-10-15 NOTE — Discharge Summary (Signed)
[name], [name]              ACCOUNT NO.:  [id number]   MEDICAL RECORD NO.:  [id number]          PATIENT TYPE:  INP   LOCATION:  A214                          FACILITY:  [facility]   PHYSICIAN:  Mila Homer. Sudie Bailey, M.D.DATE OF BIRTH:  1927-06-26   DATE OF ADMISSION:  04/30/2007  DATE OF DISCHARGE:  LH                               DISCHARGE SUMMARY   This 77 year old presented to the hospital with GI bleeding.  He had a  benign 4-day hospitalization extending from April 30, 2007 to  May 03, 2007.  Vital signs remained stable.   Admission hemoglobin and hematocrit of 14.4/43.3, and a white cell count  of 19,100, of which 89% were neutrophils, 7 lymphs.  CMP showed  potassium 2.6, chloride 88, bicarb 33, glucose 137 and bilirubin of 2.2.  Cardiac markers were normal.  Alcohol level less than 5, lipase 27,  magnesium 2.4, direct bilirubin 0.4, indirect bilirubin 1.8.  Drug  screen was positive for benzodiazepines.  UA showed pH 8.5, ST 1020,  with 0 to 2 WBCs, 0 to 2 RBCs per HPF.   His recheck hemoglobin and hematocrit day of admission was 11.3/34.  The  following day 10.9/32.3, at that time his white cell count had dropped  to 7200.  BMET showed a potassium of 3.2.  Hemoglobin was 10.7 his third  day and 10.7 his fourth and final day.  Urine culture showed no growth,  and final BMET showed a potassium of 3.2, glucose 107.   He had a CT of his abdomen and pelvis which showed circumferential  distal esophageal wall thickening with associated paraesophageal  lymphadenopathy, highly suspicious for esophageal carcinoma.   He was admitted to the hospital, started on an IV of normal saline,  given Protonix 40 mg IV daily, Librium 25 mg p.o. t.i.d., 4 runs of KCL  10 mEq, after receiving p.o. and IV KCL already ER, and put on p.r.n.,  zolpidem and Tylenol.  He was put on a clear liquid diet, but that night  was n.p.o. after midnight prior to his EGD, which showed a Barrett's  esophagus and significant ulcerations to the distal esophagus.  Was an  area suspicious for carcinoma by 2 cm x 2 cm at the distal esophagus.   After the procedure he was put on Carafate slurry g q.i.d., kept on  Protonix IV and KCL 12 mEq b.i.d. added.  Required more IV potassium,  the potassium level was adequate at the time of his discharge.   FINAL DISCHARGE DIAGNOSES:  1. Gastrointestinal bleeding, secondary to ulcerated area of the      distal esophagus.  2. Barrett's esophagus, question esophageal carcinoma.  3. Anemia, secondary to hemorrhage.  4. Hypokalemia.  5. Chronic alcoholism.   DISCHARGE MEDICATIONS:  He is discharged home on:  1. Prevacid 30 mg b.i.d. (60 with 11 refills).  2. Carafate 1 g q.i.d. 10-day course (RF x0).   DISCHARGE FOLLOW UP:  Follow-up in the office in 1 week and with Dr.  Jena Gauss for follow-up EGD in the [facility] outpatient.  Biopsy results on the  esophagus are pending.  The patient is to continue KCL 20 mEq daily  (#30, with 2 refills).      Mila Homer. Sudie Bailey, M.D.  Electronically Signed     SDK/MEDQ  D:  05/03/2007  T:  05/03/2007  Job:  [id number]

## 2010-10-15 NOTE — Discharge Summary (Signed)
NAMEARJUNA, DOEDEN              ACCOUNT NO.:  [id number]   MEDICAL RECORD NO.:  [id number]          PATIENT TYPE:  INP   LOCATION:  A215                          FACILITY:  [facility]   PHYSICIAN:  Mila Homer. Sudie Bailey, M.D.DATE OF BIRTH:  Aug 25, 1927   DATE OF ADMISSION:  03/13/2007  DATE OF DISCHARGE:  10/13/2008LH                               DISCHARGE SUMMARY   HISTORY OF PRESENT ILLNESS:  This 77 year old was admitted with acute  chronic ethyl alcoholism on March 13, 2007.  He had a fairly benign 3-  day course extending from October 11-13, 2008.  Vital signs remained  stable.   On admission, white cell count 8400, hemoglobin 14.2 and CMP showed  sodium 134, potassium 3.2, bicarb 18, glucose 176, GFR greater than 60.  Alcohol level was 92 and recheck B-met showed a sodium 138, potassium  3.9, bicarb 29, glucose 102.   He was admitted to the hospital and treated with Librium protocol with  IV normal saline 125 ml an hour.  He was given an amp of multivitamin  per liter, p.r.n. Tylenol and Phenergan.  By day #2 he was already  improved, by day #3 he was up and walking and ready for discharge home.  At that time his Hep-Lock was discontinued.  He was given a prescription  for Librium 25 mg t.i.d., #100, refill times zero.  Follow-up is in the  office within a week.  He has already been considering attending at  church where he will help was alcoholism.  He is going to do either this  or AA or both upon discharge.   FINAL DISCHARGE DIAGNOSES:  1. Acute chronic ethyl alcoholism.  2. Hypokalemia.  3. Hyponatremia.  4. Hyperglycemia.  5. Chronic anxiety.      Mila Homer. Sudie Bailey, M.D.  Electronically Signed     SDK/MEDQ  D:  03/15/2007  T:  03/16/2007  Job:  [id number]

## 2010-10-15 NOTE — Group Therapy Note (Signed)
[name], BALDERSON              ACCOUNT NO.:  [id number]   MEDICAL RECORD NO.:  [id number]          PATIENT TYPE:  INP   LOCATION:  IC05                          FACILITY:  [facility]   PHYSICIAN:  Mila Homer. Sudie Bailey, M.D.DATE OF BIRTH:  27-Jun-1927   DATE OF PROCEDURE:  DATE OF DISCHARGE:                                 PROGRESS NOTE   SUBJECTIVE:  He tells me that he did not drink from 1980 to about 10-30-1997  when his wife died and [place] and  he would get depressed and start drinking and he has battled with the  alcoholism since then.   OBJECTIVE:  VITAL SIGNS:  Blood pressure 127/66, temperature 97.8, pulse  83, respiratory rate 18, and his O2 sat is 100% on room air.  GENERAL:  Appears to be alert.  His sentence structure is normal.  Speech normal.   ASSESSMENT:  Alcohol withdrawal syndrome.   PLAN:  He is still in the Ativan protocol.  Reviewed the note from the  ACT team.  At this point, he should be stable for discharge home  tomorrow.  Follow up at [facility] for the alcoholism.  I  discussed with nursing.  They will set up an appointment.      Mila Homer. Sudie Bailey, M.D.  Electronically Signed     SDK/MEDQ  D:  12/19/2008  T:  12/19/2008  Job:  [id number]

## 2010-10-15 NOTE — H&P (Signed)
NAMEDEXTER, SIGNOR              ACCOUNT NO.:  [id number]   MEDICAL RECORD NO.:  [id number]          PATIENT TYPE:  INP   LOCATION:  A213                          FACILITY:  [facility]   PHYSICIAN:  Mila Homer. Sudie Bailey, M.D.DATE OF BIRTH:  1928/02/03   DATE OF ADMISSION:  12/23/2006  DATE OF DISCHARGE:  LH                              HISTORY & PHYSICAL   HISTORY OF PRESENT ILLNESS:  This is a 77 year old patient with history  of chronic alcoholism who presented to the hospital today intoxicated.  He has had as many as three to four admissions for acute alcoholism  within a year.  He has had bouts of depression.  He is suppose to be on  chlordiazepoxide for anxiety since this has worked better than anything  else.  He lives by himself.   PHYSICAL EXAMINATION:  GENERAL:  A pleasant, elderly man who was  intoxicated.  On my exam, he seemed to be alert.  He was a little  tachycardic with heart rate being around 110.  VITAL SIGNS:  Stable.  Blood pressure 155/89, pulse a little elevated at  114, respirations 20, temperature 97.4.  LUNGS:  Clear.  I thought his color was good.  NEUROLOGIC:  His speech was normal.  Sentence structure was intact.  Sensorium appeared to be intact.  LYMPHATICS:  There is no axillary or supraclavicular adenopathy.  ABDOMEN:  Soft without hepatosplenomegaly or mass.  There was mild  distention of the abdomen.  EXTREMITIES:  No edema of the ankles.   LABORATORY DATA AND X-RAY FINDINGS:  CBC showed a hemoglobin of 12.5.  MET 7 and potassium 3.4.  Alcohol level was 208.  His urine drug screen  was positive for benzodiazepines.   ASSESSMENT:  1. Acute alcoholism.  2. Chronic alcoholism.  3. Depression.  4. Hypokalemia.  5. Mild dehydration.   PLAN:  He will be on IV fluids with normal saline at 100 mL/hour, KCl 20  mEq b.i.d.  Due to some epigastric burning, Protonix 40 mg IV daily.  Will add KCl 20 mEq p.o. b.i.d. to be given by vein if necessary.  Transfer  to [facility] in [place] tomorrow, if possible.      Mila Homer. Sudie Bailey, M.D.  Electronically Signed     SDK/MEDQ  D:  12/23/2006  T:  12/24/2006  Job:  [id number]

## 2010-10-15 NOTE — Op Note (Signed)
[name], [name]              ACCOUNT NO.:  [id number]   MEDICAL RECORD NO.:  [id number]          PATIENT TYPE:  INP   LOCATION:  A214                          FACILITY:  [facility]   PHYSICIAN:  R. Roetta Sessions, M.D. DATE OF BIRTH:  07-Jun-1927   DATE OF PROCEDURE:  04/30/2007  DATE OF DISCHARGE:                               OPERATIVE REPORT   PROCEDURE:  EG with biopsy.   INDICATIONS FOR PROCEDURE:  A 77 year old alcoholic admitted to the  hospital with hematemesis and melena.  He has had a significant drop in  his hemoglobin but remains hemodynamically stable, hemoglobin 10.9.  He  had some transient abdominal pain yesterday.  CT of the abdomen  demonstrated a circumferential thickening of the distal esophagus, a 1.3  cm adjacent lymph node, incidentally aside from fatty appearing liver.  His liver looked good as did the spleen.  There was no evidence of  portal hypertension.  It is notable that on the pelvic portion of this  CT there was a questionable polypoid lesion in his colon.  He has never  had a colonoscopy and there is a positive family history of colon cancer  in a first degree relative.  EGD is now being done.  This approach has  been discussed with the patient at length.  The potential risks,  benefits and alternatives have been reviewed, questions answered.  Please see the documentation in the medical record for the procedure.   O2 saturation, blood pressure, pulse and respirations were monitored  throughout the entire procedure.  Conscious sedation of Versed 4 mg IV,  Demerol 75 mg IV in divided doses.  Cetacaine spray for topical  pharyngeal anesthesia.   INSTRUMENT:  Pentax video chip system.   FINDINGS:  Examination through the tubular esophagus revealed severe  inflammatory changes involving the distal one half of the tubular  esophagus.  The patient was noted to have geographic ulcerations.  There  was friability and there was a question of underlying salmon  colored  epithelium.  At the EG junction there was adenomatous appearing sessile  firm localized area which was very suspicious.  Please see photos.  There were no varices.  EG junction easily traversed.  The stomach,  colon, gas cavity was emptied and insufflated well with air and thorough  examination of the gastric mucosa including retroflexion, proximal  stomach, esophagogastric junction demonstrated a large hiatal hernia  only.  The gastric mucosa appeared normal.  Pylorus patent, easily  traversed.  Examination of the bulb and second portion revealed no  abnormalities.  Therapeutic/diagnostic maneuvers were performed.  The  focal area of abnormality in the distal esophagus was biopsied  separately for histologic study, also mid esophagus biopsied to further  evaluate for Barrett's esophagus.  The patient tolerated the procedure  well.   IMPRESSION:  Marked inflammatory changes of the distal one half tubular  esophagus consistent with severe geographic ulcerative reflux  esophagitis, localized area of adenomatous appearing firm raised sessile  tissue distal esophagus suspicious for neoplasm, biopsied (this lesion  was not obviously neoplasm but did stand out in a setting  of marked  inflammation of the distal esophagus).  Large hiatal hernia, otherwise  normal stomach D1, D2.   RECOMMENDATIONS:  1. Increase Protonix to 40 mg orally b.i.d.  2. Carafate 1 gram slurry q.i.d.  3. Advance diet.  4. Outpatient colonoscopy in the very near future and follow up on      path.  If the biopsies of the distal esophageal lesion are not      consistent with carcinoma I would advocate he get in for endoscopic      ultrasound to further evaluate this area.   His blood pressure was elevated during the procedure and that needs to  be followed up later today on the floor.      [name], M.D.  Electronically Signed     RMR/MEDQ  D:  05/01/2007  T:  05/01/2007  Job:  [id number]   cc:    Mila Homer. Sudie Bailey, M.D.  Fax: [phone]

## 2010-10-18 NOTE — H&P (Signed)
[facility]  Patient:    [name], [name] Visit Number: [id number] MRN: [id number]          Service Type: EMS Location: [facility] Attending Physician:  Cathren Laine Dictated by:   Candi Leash. Orsini, N.P. Admit Date:  12/11/2001 Discharge Date: 12/11/2001                     Psychiatric Admission Assessment  IDENTIFYING INFORMATION:  A 77 year old widowed white male, voluntarily admitted on December 11, 2001.  HISTORY OF PRESENT ILLNESS:  The patient presents with a history of alcohol abuse, drinking up to two fifths of liquor "rotgut" wine every day for the past 2-3 weeks.  He considers himself "a binge drinker," feels that he has had enough of his drinking.  He needs to be there for his mother, feels like he is throwing his money away.  His sleep has been decreased, his appetite has been satisfactory.  He reports no weight change.  He feels somewhat depressed and overwhelmed after his wife has died.  He denies any suicidal or homicidal ideations or psychosis.  He is unsure how he will stay away from alcohol after discharge.  PAST PSYCHIATRIC HISTORY:  Multiple hospitalizations for alcohol detox.  His longest history of sobriety has been 20 years, stopped drinking from 93 to 2000.  He has no history of a suicide attempt.  SOCIAL HISTORY:   He is a 77 year old widowed white male.  He lives alone.  He has been widowed for 2 years.  He drinks alone.  FAMILY HISTORY:  He denies.  ALCOHOL DRUG HISTORY:  He drinks up to 2 fifths of wine and liquor every day for the past 3 weeks.  Last drink was day before admission.  No history of blackouts or seizures.  He denies any substance abuse.  PAST MEDICAL HISTORY:  Primary care Saralee Bolick is [place] [facility].  Medical problems are none.  Medications are none.  DRUG ALLERGIES:  ASPIRIN, says he gets an "upset stomach."  PHYSICAL EXAMINATION:  Performed at [facility].  CBC within normal  limits.  Urine drug screen was positive for benzos.  CMET showed a potassium of 3.1.  Alcohol level was 31.  MENTAL STATUS EXAMINATION:  The patient was sleeping in bed.  He aroused easily and got dressed for interview, cooperative but little eye contact.  He is disheveled.  Speech is clear, mood is depressed, affect is flat.  Thought processes are coherent.  There is no evidence of psychosis, no auditory or visual hallucinations, no suicidal or homicidal ideations.  Cognitive function intact.  Memory is fair, judgment is fair, insight is fair, poor impulse control.  ADMISSION DIAGNOSES: Axis I:    1. Alcohol dependence.            2. Depressive disorder not otherwise specified. Axis II:   Deferred. Axis III:  None. Axis IV:   Problems with primary support group, lack of, other psychosocial            problems. Axis V:    Current is 25, this past year 60-65.  PLAN:  Voluntary admission for alcohol dependence, depression.  Contract for safety, check every 15 minutes.  We will encourage fluids, offer a potassium supplement, recheck potassium in the morning.  We will consider an antidepressant to decrease depressive symptoms.  Risks and benefits were discussed with patient.  The patient to follow up with AA, remain alcohol free, and to detox safely.  TENTATIVE  LENGTH OF CARE:  4-5 days. Dictated by:   Candi Leash. Orsini, N.P. Attending Physician:  Cathren Laine DD:  12/12/01 TD:  12/13/01 Job: [id number]

## 2010-10-18 NOTE — Group Therapy Note (Signed)
   [name], [name]                        ACCOUNT NO.:  [id number]   MEDICAL RECORD NO.:  [id number]                   PATIENT TYPE:  INP   LOCATION:  A218                                 FACILITY:  [facility]   PHYSICIAN:  Mila Homer. Sudie Bailey, M.D.           DATE OF BIRTH:  [birth year]   DATE OF PROCEDURE:  06/17/2002  DATE OF DISCHARGE:                                   PROGRESS NOTE   SUBJECTIVE:  The patient says he feels much better today.  Stronger.  Still  his is IV is going.   OBJECTIVE:  GENERAL:  He is sitting up in bed, oriented, alert.  He is able  to answer many questions I gave him and all correctly.  He is well-  developed, well-nourished; no acute distress.  VITAL SIGNS:  Temperature 97 degrees, pulse 98, respiratory rate 20, blood  pressure 177/85.  CHEST:  The lungs are clear throughout.  CARDIAC:  The heart is irregular without murmur or rub, rate of about 70.  ABDOMEN:  The abdomen is soft without hepatosplenomegaly or mass.  EXTREMITIES:  There is no edema of the ankles.   LABORATORIES:  Potassium today is 2.8.  Sodium 133.   His EGD through Dr. Karilyn Cota yesterday showed extensive ulcerations in the  distal 6 cm of the esophagus.   ASSESSMENT:  1. Electrolyte disturbances including hypokalemia and hyponatremia.  2. Alcohol withdrawal syndrome.  3. Esophageal ulcerations possibly secondary to vomiting but also to alcohol     and reflux.   PLAN:  Increase the K-Dur to 20 mEq b.i.d. and add more Kay Ciel by vein.  Use Ativan 1 mg p.o. three times daily p.r.n. and discontinue IV Ativan.  His Protonix has been increased to 40 mg p.o. b.i.d. by Dr. Karilyn Cota, his  gastroenterologist.  Possibly discharge tomorrow if his MET-7 is okay.  I  have discussed with hospitalists.                                               Mila Homer. Sudie Bailey, M.D.    SDK/MEDQ  D:  06/17/2002  T:  06/18/2002  Job:  [id number]

## 2010-10-18 NOTE — Discharge Summary (Signed)
[name], COLL              ACCOUNT NO.:  [id number]   MEDICAL RECORD NO.:  [id number]          PATIENT TYPE:  INP   LOCATION:  A219                          FACILITY:  [facility]   PHYSICIAN:  Mila Homer. Sudie Bailey, M.D.DATE OF BIRTH:  1927/06/15   DATE OF ADMISSION:  03/21/2006  DATE OF DISCHARGE:  10/23/2007LH                                 DISCHARGE SUMMARY   This 77 year old was admitted to the hospital for alcohol withdrawal  syndrome.  He had a benign three day hospitalization extending from March 22, 2006 to March 24, 2006.  Vital signs remained stable.   Blood tests included admission CBC which was normal, but his BMP showed  potassium 2.5, chloride 90, glucose 123.  Rechecked the following day his  potassium was up to 3.6, chloride 96, and the day prior to discharge  potassium was 3.5 and chloride 104.  Glucose was 99 at that time.  Admission  alcohol was 28 (1-10 normal).  Drug screen was positive for benzodiazepines.  Magnesium level was normal and repeat CBCs were normal except hemoglobin was  slightly low at 11.1.   HOSPITAL COURSE:  He was admitted to the hospital and put on IV normal  saline 100 mL an hour with Kcl 40 mEq four hours, Librium protocol, thiamine  100 mg p.o. daily, folic acid 1 mg p.o. daily, and Zofran 4 mg for nausea.  He was also given Protonix 40 mg IV b.i.d., Ativan 1 mg IV x1 for severe  agitation and Kcl 40 mEq p.o. one time dose.  He was seen by the ACT team.   By his second day, his IV was discontinued. He was put on a Hep-Lock.  He  was able the ambulate in the hall. By his third day, he continued to do  well.  He was oriented, alert, no sign of delirium tremens.  He was felt to  be stable for discharge home.  He had the number for [facility] to call immediately when he got home and he knew how to get to  [employer] in town.  He was to use the lorazepam he already has at home  t.i.d. dose.   DISCHARGE  DIAGNOSES:  1. Alcohol withdrawal syndrome.  2. Hypokalemia.  3. Hypochloremia.  4. Hyperglycemia.  5. Episodic alcohol use.      Mila Homer. Sudie Bailey, M.D.  Electronically Signed     SDK/MEDQ  D:  03/24/2006  T:  03/25/2006  Job:  [id number]

## 2010-10-18 NOTE — Discharge Summary (Signed)
[facility]  Patient:    [name], [name] Visit Number: [id number] MRN: [id number]          Service Type: PSY Location: 400 [id number] Attending Physician:  Jeanice Lim Dictated by:   Jeanice Lim, M.D. Admit Date:  10/05/2001 Discharge Date: 10/08/2001                             Discharge Summary  IDENTIFYING DATA:  This is a 77 year old Caucasian male voluntarily admitted, presenting to the emergency room with thoughts of wanting to drink himself to death or possibly shoot himself, reporting suicidal ideation.  The patient had been treated previously for depression and detoxification in June 2002.  ADMISSION MEDICATIONS: 1. Pepcid. 2. Librium t.i.d. p.r.n. anxiety.  ALLERGIES:  No known allergies.  PHYSICAL EXAMINATION:  GENERAL:  Within normal limits.  NEUROLOGIC:  Nonfocal.  ROUTINE ADMISSION LABORATORY DATA:  Alcohol level was 215, decreased to 170 prior to transfer from the emergency room.  CBC was within normal limits. Metabolic panel showed low potassium at 2.8 which was corrected.  Urine drug screen was positive for benzodiazepines.  MENTAL STATUS EXAMINATION:  Thin, tall male, fully alert, in no acute distress with mild psychomotor slowing.  Blunted affect, cooperative.  Speech: Soft in tone with no spontaneity of speech.  Mood: Depressed, feeling guilty about drinking and repeated relapse, is proud of sobriety which he had for 20 years. Thought process: Goal directed.  Thought content: Negative for acute suicidal ideation or psychotic symptoms.  Cognitive: Intact.  ADMITTING DIAGNOSES: Axis I:    1. Alcohol dependence.            2. Depression, not otherwise specified. Axis II:   None. Axis III:  1. Gastritis by history.            2. Contusion of right knee.            3. Hypokalemia. Axis IV:   Moderate problems with primary support system. Axis V:    30/60  HOSPITAL COURSE:  The patient was admitted and ordered  routine p.r.n. medications.  He was given potassium, Protonix, and placed on a phenobarbital detoxification protocol for safe withdrawal with monitoring.  The patient participated in individual, group, and milieu therapy, benefiting from clinical intervention.  CONDITION AT DISCHARGE:  Improved.  There were no acute withdrawal symptoms. Mood: More euthymic.  Affect: Slightly brighter.  Thought process: Goal directed.  Thought content: Negative for dangerous ideation or psychotic symptoms.  The patient reported motivation to be compliant with the followup plan.  DISCHARGE MEDICATIONS: 1. Trazodone 150 mg q.h.s. 2. Seroquel 25 mg q.4h. p.r.n. agitation.  FOLLOWUP:  AA and substance abuse treatment as well as with his primary care physician.  DISCHARGE DIAGNOSES: Axis I:    1. Alcohol dependence.            2. Depression, not otherwise specified. Axis II:   None. Axis III:  1. Gastritis by history.            2. Contusion of right knee.            3. Hypokalemia. Axis IV:   Moderate problems with primary support system. Axis V:    Global assessment of functioning in discharge was 55. Dictated by:   Jeanice Lim, M.D. Attending Physician:  Jeanice Lim DD:  11/10/01 TD:  11/12/01 Job: [id number]

## 2010-10-18 NOTE — Discharge Summary (Signed)
[facility]  Patient:    [name], [name]                     MRN: [id number] Adm. Date:  [id number] Disc. Date: [id number] Attending:  Geoffery Lyons A                           Discharge Summary  CHIEF COMPLAINT AND HISTORY OF PRESENT ILLNESS:  This is the first admission to [facility] for this 77 year old male with involuntary admission due to the patients alcohol abuse, drinking heavily for the past five days, 2 fifths of wine per day for the past five days and then intermittently prior to that for the past one and one-half years.  States "had to do something to get some help or I was going to die from my drinking."  The patient reports she has abused alcohol in the distant past, currently has abstained from 1980 to 1988 and then began drinking after his wifes death. He reported he found his wife dead on the floor and has been unable to use that room since that time.  The patient reports current stress of having to care for his elderly mother who is at home with Alzheimers.  He drives to her home every evening to spend the night with her, and she has a caregiver during the day.  The patient reports history of hallucinations, seeing cats and dogs only when he is drinking.  No visual hallucinations at this time.  no history of auditory hallucinations.  The patient reports some poor sleep over the past one to two months with initial intermittent insomnia progressively increasingly anhedonia, along with fatigue and intermittent insomnia, and increased sadness and crying.  Reports that he has decreased appetite for the past month with 10-pound weight loss.  PAST PSYCHIATRIC HISTORY:  Reports no previous treatment.  SUBSTANCE ABUSE HISTORY:  Denies any drug use, alcohol use as noted above.  PAST MEDICAL HISTORY:  Status post endoscopy secondary to chronic stomach upset and pain.  Hospitalized one month ago at [facility] for drinking for  for his stomach.  He also has osteoarthritis in his knees and has been otherwise healthy.  MENTAL STATUS EXAMINATION: Reveals well-nourished, well-developed, disheveled male who generally appears healthy.  He is in hospital gown, cooperative and pleasant with blunted affect.  Speech is spontaneous and normal in pace and tone.  Mood is sad and tearful.  Thought process logical and coherent.  No evidence of suicidal or homicidal ideas.  No auditory or visual hallucinations.  No evidence of psychosis.  Cognition: Oriented x 3.  ADMISSION DIAGNOSES: Axis I:    1. Major depression, single episode.            2. Alcohol dependence. Axis II:   No diagnosis. Axis III:  1. Gastritis.            2. Osteoarthritis. Axis IV:   Moderate. Axis V:    Global assessment of functioning upon admission 35, highest in the            last year 65 to 70.  COURSE IN THE HOSPITAL:  He was admitted and started on intensive individual and group psychotherapy.  Blood chemistry was within normal limits.  Physical exam was performed at [facility] and was within normal limits.  He was detoxified using Librium; detoxification went uneventfully.  He was given Celexa that was increased up  to 20 mg per day, and he was given Seroquel for sleep.  June 30, he was in full contact with reality, fully detoxified.  Mood was euthymic, affect bright and broad.  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  Do some work to resolve the death of his wife to be able to put the house for sale and move home.  He will go to AA.  He will continue followup at with [facility].  DISCHARGE DIAGNOSES: AXIS I.   1. Major depression, single episode.           2. Alcohol dependence. AXIS II.  No diagnosis. AXIS III. 1. Gastritis.           2. Osteoarthritis. AXIS IV.  Moderate. AXIS V.   Global assessment of functioning upon discharge 55 to  60.  DISCHARGE MEDICATIONS: 1. Celexa 20 mg per day. 2. Seroquel 25 mg at bedtime. 3. Trazodone 100 mg for sleep.  FOLLOWUP:  He is to pursue followup at [facility]. DD:  12/29/00 TD:  01/01/01 Job: [id number]

## 2010-10-18 NOTE — H&P (Signed)
[facility]  Patient:    [name] [name]                     MRN: [id number] Adm. Date:  [id number] Attending:  Denny Peon Dictator:   Young Berry Lorin Picket, R.N., N.P.                   Psychiatric Admission Assessment  IDENTIFYING INFORMATION:  This is a 77 year old Caucasian male who is widowed, voluntary admission for depression and alcohol abuse.  HISTORY OF PRESENT ILLNESS:  The patient reports drinking heavily for the past five days, two fifths of wine per day for the past five days, and then intermittently prior to that for the past 1-1/2 years.  The patient states, "I had to do something to get some help or I was gonna die from my drinking." The patient reports that he had abused alcohol in the distant past.  Currently he has abstained from alcohol from 1980 to 1988 and then began drinking after his wifes death.  He reports that he had found his wife dead on the floor and also has been unable to use that room since that time.  The patient reports current stressors of having to care for his elderly mother who is at home with Alzheimers.  He drives to her home every evening to spend the night with her and she has a caregiver during the day.  The patient reports a history of visual hallucinations, seeing cats and dogs only when he is drinking, no visual hallucinations at this time.  No history of auditory hallucinations. The patient reports some poor sleep for the past 1-2 months with initial and middle insomnia, progressively increasing anhedonia along with fatigue, and increased sadness and crying.  The patient reports that he has had decreased appetite also for the past month with an approximate 10 pound weight loss.  PAST PSYCHIATRIC HISTORY:  The patient reports no history of inpatient admissions or outpatient treatment.  The patient stated at one point he felt that he had been depressed all of his life, but he has never sought  treatment in the past.  SOCIAL HISTORY:  The patient currently lives alone and is driving to [place] daily to care for his elderly mother.  He is currently widowed.  He has one daughter and two sons.  He denies any legal or financial problems.  FAMILY HISTORY:  Negative for mental health or substance abuse issues.  ALCOHOL AND DRUG HISTORY:  The patient denies any drug abuse.  ETOH abuse issues already noted previously.  MEDICAL HISTORY:  The patient is followed by Dr. Sudie Bailey in [place], last seen one month ago.  Medical problems include status post endoscopy secondary to chronic stomach upset and pain.  The patient was hospitalized approximately one month ago at [facility] for his drinking and for the stomach pain.  The patient also has osteoarthritis of his knees and has been otherwise healthy.  MEDICATIONS:  Include Ativan three times a day which Dr. Sudie Bailey prescribed for him about a month ago.  Prior to that he was taking some Xanax.  DRUG ALLERGIES:  ASPIRIN produces stomach upset.  POSITIVE PHYSICAL FINDINGS:  The patient was seen in the emergency room at [facility] on June 26 and treated with Librium 50 mg, thiamine 100 mg p.o., multivitamin, and folate 1 mg.  The patient also received K-Dur 60 mEq for potassium level of 2.9.  Stat potassium this morning  is 3.5.  His urine drug screen was positive for benzodiazepines, otherwise negative.  ETOH level was 264 when he was admitted to the emergency room and 191 upon transfer to the unit.  The patient also received Phenergan 12.5 mg.  On admission to the unit here his vital signs are:  Temperature 99.8, pulse 110, respirations 16, blood pressure 142/90.  His physical exam in the emergency room was negative.  MENTAL STATUS EXAMINATION:  This is an elderly, disheveled male who generally appears healthy.  He is in bed in a hospital gown.  He is cooperative and pleasant with blunted affect.  Speech is  spontaneous and normal in pace and tone.  Mood is sad and tearful.  Thought processes logical and coherent.  No evidence of suicidal or homicidal ideation.  No auditory or visual hallucinations, no evidence of psychosis.  Cognitively, he is oriented x 3 and is intact.  DIAGNOSES: Axis I:    1. Major depression, single episode.            2. ETOH dependence. Axis II:   None. Axis III:  1. Gastritis per history.            2. Osteoarthritis in his knees. Axis IV:   Moderate, problems related to grief over the death of his wife and            stress in caring for his mother. Axis V:    Current is 35, past year 93.  PLAN:  Plan is to admit the patient to stabilize his mood and detox him from alcohol.  We will push Gatorade and other fluids.  We are going to start him on Librium protocol, a low dose protocol without a loading dose since he did have Librium in the emergency room.  We will also start him on Celexa 10 mg p.o. daily for his depression and provide him with Seroquel 12.5 mg p.o. q.h.s. to help with his racing thoughts and his insomnia.  We will also provide him with Phenergan on a p.r.n. basis and multivitamin.  Estimated length of stay is 3-5 days. DD:  11/26/00 TD:  11/26/00 Job: [id number]

## 2010-10-18 NOTE — Discharge Summary (Signed)
[name], [name]                        ACCOUNT NO.:  [id number]   MEDICAL RECORD NO.:  [id number]                   PATIENT TYPE:  IPS   LOCATION:  0300                                 FACILITY:  [facility]   PHYSICIAN:  Jeanice Lim, M.D.              DATE OF BIRTH:  1928/01/03   DATE OF ADMISSION:  10/23/2002  DATE OF DISCHARGE:  10/26/2002                                 DISCHARGE SUMMARY   IDENTIFYING DATA:  This is a 77 year old Caucasian male voluntarily  admitted.  He presented to the emergency department drinking three to four  fifths of wine daily for the past three weeks.  He had a long history of  heavy alcohol dependency as well as multiple detoxifications and a period of  sobriety until his wife died.  This LPA was 20 years, from 78 to 2000.   MEDICATIONS:  1. Librium 25 mg t.i.d.  2. Protonix.  3. Vitamins.   DRUG ALLERGIES:  ASPIRIN.   PHYSICAL EXAMINATION:  GENERAL:  Essentially within normal limits.  NEUROLOGIC:  Nonfocal.   LABORATORY DATA:  Routine admission labs were essentially within normal  limits.   MENTAL STATUS EXAM:  Older, medium built, dysphoric male.  Speech: Within  normal limits.  Passively cooperative.  Mood: Dysphoric, depressed.  Affect:  Restricted.  Thought process: Goal directed.  Thought content: Negative for  dangerous ideation or psychotic symptoms.  Cognitive: Intact.  The patient  reported motivation to become sober, reported feeling very lonely and sad  and isolated at times.   ADMISSION DIAGNOSES:   AXIS I:  Alcohol dependence.   AXIS II:  None.   AXIS III:  Gastritis by history.   AXIS IV:  Moderate problems related to support system.   AXIS V:  29/60   HOSPITAL COURSE:  The patient was admitted, ordered routine p.r.n.  medications, underwent further monitoring, and was encouraged to participate  in individual, group, and milieu therapy.  He was placed on a low dose  Librium detoxification protocol with loading  dose for safe withdrawal due to  the severity and chronicity of alcohol intake.  The patient complained of  some medical complaints initially, gradually reported improvement with  decrease in withdrawal symptoms, and was started on Zoloft, targeting clear  depressive component in addition to continuing safe detoxification.  The  patient reported feeling that he had improved.  He was sleeping well.  He  had a more positive outlook and reported motivation to be sober and followup  with outpatient recommendations.   CONDITION ON DISCHARGE:  His condition on discharge was improved.  Mood:  More euthymic.  Affect: Brighter.  Thought processes: Goal directed.  Thought content: Negative for dangerous ideation or psychotic symptoms.  The  patient reported motivation to be compliant with the aftercare plan.   DISCHARGE MEDICATIONS:  1. Protonix 40 mg b.i.d.  2. Carafate 1 g three times a day.  3. Zoloft 50 mg q.a.m.  4. Seroquel 100 mg q.h.s.   FOLLOW UP:  The patient was to follow up with [facility] November 01, 2002, at 9 a.m.   DISCHARGE DIAGNOSES:   AXIS I:  Alcohol dependence.   AXIS II:  None.   AXIS III:  Gastritis by history.   AXIS IV:  Moderate problems related to support system.   AXIS V:  Global assessment of functioning on discharge was 55.                                               Jeanice Lim, M.D.    JEM/MEDQ  D:  11/17/2002  T:  11/17/2002  Job:  [id number]

## 2010-10-18 NOTE — Group Therapy Note (Signed)
   NAMELIOR, HOEN                          ACCOUNT NO.:  [id number]   MEDICAL RECORD NO.:  [id number]                   PATIENT TYPE:   LOCATION:                                       FACILITY:   PHYSICIAN:  Mila Homer. Sudie Bailey, M.D.           DATE OF BIRTH:   DATE OF PROCEDURE:  06/15/2002  DATE OF DISCHARGE:                                   PROGRESS NOTE   SUBJECTIVE:  This patient was admitted to the hospital last night with  gastritis and alcohol withdrawal.  He says that he is feeling better today  but is still vomiting.  He says he had hallucinations.   OBJECTIVE:  Temperature is 98.1; pulse 105; respiratory rate 20, blood  pressure 175/85.  He is well-developed and well-nourished, currently  vomiting.  He appears to be alert.  The heart today is regular rhythm  without murmur and a rate of 110.  The lungs are clear moving air.  Abdomen  is soft without demonstrable hepatosplenomegaly or mass.  Mild tenderness in  the abdomen.  There is no edema of the ankles.  Skin turgor is normal.  He  has a mild sweat.   His admission potassium is 2.7, sodium 132, chloride 95, glucose 202.  On  recheck this morning: Sodium 136, potassium 3.0.  Admission creatinine  kinase was 580, recheck 62852.  MB was 4.9, recheck 3.5 and 5.8, but his  relative index was only 0.8 and 0.6.  Troponin level 0.02.   ASSESSMENT:  1. Gastritis secondary to ethyl alcoholism.  2. Dehydration.  3. Electrolyte disturbance, is secondary to vomiting including:     a. Hypokalemia.     b. Hyponatremia.  4. Alcohol withdrawal syndrome.  5. Chronic ethyl alcoholism.   PLAN:  Continue with IV fluids, increase to 150 cc an hour.  He has had four  20 mEq KCl IV infusions last night and will repeat that today.  Recheck his  MET-7 and CBC in the morning.  Continue with Ativan q.8h. as ordered IV, but  q.3h. p.r.n. severe anxiety.                                               Mila Homer. Sudie Bailey, M.D.    SDK/MEDQ  D:  06/15/2002  T:  06/15/2002  Job:  [id number]

## 2010-10-18 NOTE — H&P (Signed)
[facility]  Patient:    [name], [name] Visit Number: [id number] MRN: [id number]          Service Type: PSY Location: [id number] [id number] Attending Physician:  Jeanice Lim Dictated by:   Young Berry Scott, R.N. N.P. Admit Date:  10/05/2001                     Psychiatric Admission Assessment  DATE OF ADMISSION:  Oct 05, 2001  DATE OF ASSESSMENT:  Oct 05, 2001, at 8:45 a.m.  PATIENT IDENTIFICATION:  This is a 77 year old Caucasian male who is a voluntary admission.  HISTORY OF PRESENT ILLNESS:  This patient was referred by the emergency room after he presented there with thoughts of wanting to "drink himself to death" or possibly shoot himself.  He reports he has been drinking one to two fifths of alcohol for approximately three weeks now.  He resumed his drinking about three weeks ago, he cites secondary to the stress of helping care for his aging parents who live with his sister in [place].  He had been given Librium 25 mg t.i.d. to take as needed by his primary care physician for symptoms of anxiety.  He denies any previous history of seizures.  He states he was sober for 20 years from 66 to 20-Oct-1998 and then his wife died and since that time he has been drinking alcohol on and off.  He stopped his Celexa which was prescribed here approximately one year ago and states he really never took it any length of time because he could not afford to get it.  Today he denies any suicidal ideation or homicidal ideation or auditory or visual hallucinations.  PAST PSYCHIATRIC HISTORY:  He has no current psychiatric Winston Sobczyk.  This is his second admission to [facility].  He was previously admitted here for treatment of depression and detoxification from alcohol in June 2002.  He has no history of prior suicide attempts.  SUBSTANCE ABUSE HISTORY:  The patients urine drug screen was positive for benzodiazepines and, as he reports, he has been taking his Librium 25  mg up to three times daily when he feels anxious.  PAST MEDICAL HISTORY:  The patient is followed by Dr. Sudie Bailey in [place], [place].  Medical problems include the patient complains of burning in his stomach for which he takes Pepcid [facility] and he has a history of osteoarthritis in his knees and contusion of his right knee when he fell through a floor that he was repairing last week.  Past medical history is remarkable for history of renal calculi and the patient is status post appendectomy.  MEDICATIONS: 1. Pepcid AC. 2. Librium 25 mg t.i.d. p.r.n. for anxiety.  DRUG ALLERGIES:  None.  PHYSICAL EXAMINATION:  VITAL SIGNS:  Temperature 98.7, pulse 94, respirations 20, blood pressure 155/88.  He weighed 181 pounds on admission and is 5 feet 10 inches tall.  GENERAL:  Unremarkable.  LABORATORY DATA:  ETOH level in the emergency room was initially 215, then decreased to 170 prior to transfer.  CBC was within normal limits with hemoglobin 14.3, hematocrit 42, MCV 8.5, and platelets 185.  Metabolic panel showed initially deficient in potassium at 2.8 and we have given him some K-Dur and potassium is currently 4.1.  BUN 16, creatinine 1.2.  Total direct bilirubin 1.5, SGOT 32, SGPT 23.  Urine drug screen was positive for benzodiazepines.  Thyroid panel is currently pending.  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  He currently helps his sister care for his aging parents.  As previously noted, he resumed a drinking pattern after 20 years of sobriety after his wife died in 11/07/98.  The patient continues to feel sadness over the loss of his wife even though she died two years ago that continues to be a predominant concern on his mind.  FAMILY HISTORY:  The patient denies any history of mental illness or substance abuse.  MENTAL STATUS EXAMINATION:  This is a thin, tall male who is fully and in no acute distress.  He has mild  psychomotor slowing, a blunted affect but he is cooperative and polite.  Speech is soft in tone and most of his answers are mumbled, no spontaneity to his speech.  Mood is depressed; he also feels quite guilty about his drinking and his repeated relapses although he is proud of his sobriety for 20 years.  Thought process is logical and coherent.  He is quite discouraged over his relapse.  No evidence of suicidal ideation today or homicidal ideation, no evidence of psychosis.  Cognitive: Intact.  ADMISSION DIAGNOSES: Axis I:    1. Ethyl alcohol abuse and dependence.            2. Rule out depression, recurrent, moderate. Axis II:   None. Axis III:  1. Gastritis by history.            2. Contusion of right knee.            3. Hypokalemia. Axis IV:   Moderate problems with stress and caring for his aging parents and            continued grief over the loss of his wife. Axis V:    Current 30, past year 16.  INITIAL PLAN OF CARE:  Plan is to voluntarily admit the patient to detoxify him from alcohol with q.66m. checks in place and to evaluate his mood given his history of depression.  Our goal is a safe detoxification in five days. We have elected to start him on a phenobarbital protocol and we also will give him Gatorade one bottle q.i.d. for the first couple of days, Protonix 40 mg daily, and Tylenol for his knee.  We will consider starting him on Lexapro possibly after he makes some progress with detoxification and we are able to better evaluate his mood.  ESTIMATED LENGTH OF STAY:  Five days. Dictated by:   Young Berry Scott, R.N. N.P. Attending Physician:  Jeanice Lim DD:  10/05/01 TD:  10/05/01 Job: [id number]

## 2010-10-18 NOTE — Discharge Summary (Signed)
[name], [name]                        ACCOUNT NO.:  [id number]   MEDICAL RECORD NO.:  [id number]                   PATIENT TYPE:  IPS   LOCATION:  0502                                 [facility]ACILITY:  [facility]   PHYSICIAN:  Jeanice Lim, MD                DATE OF BIRTH:  November 08, 1927   DATE OF ADMISSION:  12/11/2001  DATE OF DISCHARGE:  12/15/2001                                 DISCHARGE SUMMARY   IDENTIFYING DATA:  This is a 77 year old Caucasian male voluntarily admitted  with a history of alcohol abuse, drinking up to two fifths of liquor per day  with a binge-drinking pattern.  Reporting decreased sleep and appetite,  feeling overwhelmed and depressed.   MEDICATIONS:  None.   ALLERGIES:  ASPIRIN.   PHYSICAL EXAMINATION:  Essentially within normal limits.  Neurologically  nonfocal.   LABORATORY DATA:  CBC within normal limits.  Urine drug screen positive for  benzodiazepines.  CMET showed a low potassium of 3.1.   MENTAL STATUS EXAM:  The patient was sleepy, arousing easily.  Disheveled,  cooperative with little eye contact.  Speech clear.  Depressed.  Affect  flat.  Thought processes goal directed.  thought content negative for  dangerous ideation or psychotic symptoms.  Cognitively intact.  Judgment and  insight fair.   ADMISSION DIAGNOSES:   AXIS I:  Alcohol dependence.   AXIS II:  None.   AXIS III:  None.   AXIS IV:  Moderate (problems with primary support group).   AXIS V:  45/60.   HOSPITAL COURSE:  The patient was admitted and ordered routine p.r.n.  medications and placed on a detox protocol for safe withdrawal and  phenobarbital with close monitoring.  Participated in individual, group and  milieu therapy and was started on Zoloft for depressive symptoms.  The  patient tolerated clinical intervention, showing a positive response.   CONDITION ON DISCHARGE:  Improved.  Mood was more euthymic.  Affect  brighter.  Thought processes goal directed.  Thought  content negative for  dangerous ideation or psychotic symptoms.  There is no acute withdrawal  symptoms.   DISCHARGE MEDICATIONS:  Zoloft 25 mg q.a.m.   FOLLOW UP:  [facility] on December 21, 2001 at 9 a.m. and  [place].   DISCHARGE DIAGNOSES:   AXIS I:  Alcohol dependence.   AXIS II:  None.   AXIS III:  None.   AXIS IV:  Moderate (problems with primary support group).   AXIS V:  Global Assessment of Functioning on discharge 55.                                               Jeanice Lim, MD    JEM/MEDQ  D:  01/26/2002  T:  01/29/2002  Job:  [id number]

## 2010-10-18 NOTE — H&P (Signed)
[name], [name]              ACCOUNT NO.:  [id number]   MEDICAL RECORD NO.:  [id number]          PATIENT TYPE:  INP   LOCATION:  A216                          FACILITY:  [facility]   PHYSICIAN:  Mila Homer. Sudie Bailey, M.D.DATE OF BIRTH:  02-02-1928   DATE OF ADMISSION:  03/10/2006  DATE OF DISCHARGE:  LH                                HISTORY & PHYSICAL   This 77 year old has been drinking heavily the last three to four weeks.  He  has had intermittent problems with ethyl alcoholism.  He presented to the  emergency department.   Last seen in the office was just two weeks ago.  At that point he told me he  stopped drinking and his GERD was doing well.  His epigastric abdominal pain  had resolved at that point and I had taken him off Prevacid.  He was on  Lorazepam 1 mg three times daily, 2 at bedtime, felt to be safe within the  Librium he had used in the past.  He was also seen in the emergency room  here June 2007 and ended up with hematemesis.   He has generally been healthy.  He has had problems with insomnia and  hypertension.  There is a family history of manic depressive illness but  testing on him last March in our office was negative for manic depressive.   He has been generally jittery and anxious.  He stopped alcohol completely  yesterday.   PHYSICAL EXAMINATION:  GENERAL:  A pleasant elderly man who is really in  very good physical shape.  He is a little bit tremulous.  He is not tearful.  He is not showing any signs of depression.  He is oriented and alert.  Speech is normal, sentence structure intact.  VITAL SIGNS:  Temperature 97.4, pulse 95, respiratory rate 18, blood  pressure 131/80.  HEENT:  Pharynx normal, negative anterior cervical nodes.  There is no axial  or supraclavicular adenopathy.  LUNGS:  Clear throughout.  HEART:  Regular rhythm and rate of 70.  ABDOMEN:  Soft without hepatosplenomegaly or mass.  There is no tenderness  of the liver or elsewhere on  deep palpation.  There is no edema of the  ankles.  His height is 70 inches, weight 182 pounds.  Oxygen saturation is 975.   Alcohol level on admission was 242.  Drug screen was positive for  benzodiazepines.  Glucose was 135.  CBC normal.  Magnesium level 2.0.   ADMISSION DIAGNOSES:  1. Alcohol withdrawal syndrome.  2. Episodic ethyl alcoholism.  3. Essential hypertension.  4. History of tobacco use.  5. Anxiety.  6. Insomnia.  7. History of epigastric abdominal pain, probably secondary to ETOH.   PLAN:  Keep him on IV fluids.  Librium 50 mg by mouth in the emergency  department helped him remarkably and he is now on 25 mg by mouth  four times  daily as needed.  Thiamine 100 mg IV today, tomorrow and multivitamins.  Will reassess tomorrow.      Mila Homer. Sudie Bailey, M.D.  Electronically Signed     SDK/MEDQ  D:  03/10/2006  T:  03/11/2006  Job:  [id number]

## 2010-10-18 NOTE — H&P (Signed)
[name], [name]              ACCOUNT NO.:  [id number]   MEDICAL RECORD NO.:  [id number]          PATIENT TYPE:  INP   LOCATION:  A219                          FACILITY:  [facility]   PHYSICIAN:  Catalina Pizza, M.D.        DATE OF BIRTH:  May 28, 1928   DATE OF ADMISSION:  03/21/2006  DATE OF DISCHARGE:  LH                                HISTORY & PHYSICAL   PRIMARY CARE PHYSICIAN:  Mila Homer. Sudie Bailey, M.D.   HISTORY OF PRESENT ILLNESS:  [name] is a 77 year old gentleman who was  recently hospitalized for acute alcoholism and withdrawal syndrome back on  March 10, 2006, and was discharged on March 12, 2006.  Apparently per  his report, he was okay for approximately two days following this and for  whatever reason, went back to drinking again.  He again began to have DTs  with hallucinations of people being in his house and realized that he needed  to stop and come to the emergency department.  He was having dry heaves also  for approximately two days when this occurred.  He presented to the  emergency department and stated that he had not had anything to drink in  over two days but still had alcohol level as listed below.  He does mention  some epigastric type pain but otherwise no significant problems.   MEDICATIONS:  Patient states he has Ativan prescription which he takes  periodically, unknown exact dose of this.   PAST MEDICAL HISTORY:  1. Question of GERD.  2. History of upper GI bleed with hematemesis before in June 2007.   REVIEW OF SYSTEMS:  Patient states he continues to have some nausea and  episode of vomiting but not any significant chest pain.  Does mention some  shortness of breath occasionally.  States that he has difficulty drinking  cold liquids because burns when going down.  Coffee does make it feel  better.  He denies any problems with bowel or bladder.  No other neurologic  complaints.  Does mention hallucinations, oral and visual.   SOCIAL HISTORY:  [place]  [place] and  states he drinks approximately a bottle of [place] a day since two days  after discharge before and began having worsening jitteriness and anxiety as  he stopped drinking.   PHYSICAL EXAMINATION:  GENERAL APPEARANCE:  This is an elderly white male  lying in bed in no acute distress.  VITAL SIGNS:  Blood pressure 140/95, pulse rate 70, respirations 20,  temperature 97.2, 99% on room air.  HEENT:  Unremarkable.  Pupils are equal, round, reactive to light and  accommodation.  Mucous membranes are moist.  LUNGS:  Clear to auscultation bilaterally, question long expiratory phase  but no wheezing or rhonchi appreciated.  CARDIOVASCULAR:  Regular rate and rhythm.  No murmurs, rubs, or gallops.  ABDOMEN:  Soft, does have some mild epigastric tenderness.  Positive bowel  sounds.  EXTREMITIES:  No lower extremity edema.  Pulses 1+ in all extremities.  NEUROLOGIC:  He is alert and  oriented x3, does have some generalized  depressive qualities but does not have any specific neurologic deficits at  this time, question occasional asterixis but very minimal.   LABORATORY DATA:  CBC shows a white count of 5.9, hemoglobin 13.5.  BMET  with sodium 136, potassium 2.5, chloride 90, CO2 31, glucose 123, BUN 18,  creatinine 1, calcium 8.7.  ETOH was 28 at time of admission.  Urine drug  screen just showed positive for benzodiazepines.  Magnesium level was 2.4.   IMPRESSION:  This is a 77 year old gentleman with known alcoholism with  alcohol withdrawal type syndrome admitted for further delirium tremens  treatment.   ASSESSMENT/PLAN:  1. Alcohol withdrawal and delirium tremens:  Patient is started on the      Librium protocol and will also give a p.r.n. Ativan order if he has      severe withdrawal type syndrome.  He is started on thiamine and folic      acid.  Check a magnesium level on him and give routine fluids given his      nausea and  vomiting.  2. Epigastric pain:  Patient has had a history of hematochezia before but      denies any problems at this time.  He did state that he had had      problems with reflux before and has had some epigastric tenderness      especially after vomiting.  Unclear whether he has a history of      variceal bleed before, but will start him on Protonix 40 mg IV b.i.d.      for now as well as treating his nausea and vomiting to prevent the      retching.  3. Anxiety and depression:  Unclear whether patient has been started on      medicines before for this, but likely will need further assessment and      treatment.  Patient will need to be seen and evaluated by ACT team to      get set up for routine mental health.  The patient states he realizes      he cannot continue doing this and he has alerted his daughter that he      is in the hospital at this time, whereas the last hospitalization he      did not.  Will need to get Dr. Sudie Bailey to see the patient in the      morning and further assess any other treatment needs for him.  4. Hypokalemia:  This is probably secondary to significant drinking and      vomiting.  Will replete this and recheck  in the morning.  5. Anemia:  Drop in hemoglobin from initially 14.2 two weeks ago to 13.5,      to 11.8.  At this time, will need to continue to monitor and make sure      he does not have any signs of GI bleed from possible varices especially      with retching.  Will guaiac his stools and recheck CBC the next      morning, to be followed up by Dr. Sudie Bailey.      Catalina Pizza, M.D.  Electronically Signed     ZH/MEDQ  D:  03/22/2006  T:  03/22/2006  Job:  [id number]

## 2010-10-18 NOTE — Group Therapy Note (Signed)
NAMESADRAC, ZEOLI              ACCOUNT NO.:  [id number]   MEDICAL RECORD NO.:  [id number]          PATIENT TYPE:  INP   LOCATION:  A219                          FACILITY:  [facility]   PHYSICIAN:  Mila Homer. Sudie Bailey, M.D.DATE OF BIRTH:  02-Apr-1928   DATE OF PROCEDURE:  DATE OF DISCHARGE:                                   PROGRESS NOTE   SUBJECTIVE:  Last week another bout with his ethyl alcoholism and was  admitted over the weekend. Feels better now.   OBJECTIVE:  Temperature: 97.9. Pulse: 78. Respiratory rate: 20. Blood  pressure: 144/89. He is supine in bed and in no acute distress. Well-  developed and well-nourished and alert.  LUNGS:  Are clear throughout, moving air well.  HEART: Regular rhythm rate of 80.  ABDOMEN: Soft without hepatosplenomegaly, or mass or tenderness.  There is no edema to the ankles.   Review of his labs showed potassium at admission was 2.5 and that improved  to 3.6 yesterday and 3.5 today. Chloride was 90, which went to 96 and 104  today. Glucose 123, went to 119 and 99 today. Drug screen is positive just  for benzodiazepines. H&H 3.5 to 4.2 to 11.8 and is 34.3 now today, 11.1 and  32.8. Admission alcohol level was 28.   ASSESSMENT:  1. Delirium tremens.  2. Hypokalemia, resolved.  3. Hypochloremia.  4. Hyperglycemia.  5. Chronic intermittent ethyl alcoholism.   PLAN:  He is on benzodiazepine protocol. Will switch to Hep-Lock today and  getting him ambulating. Plan discharge tomorrow with follow up on mental  health. He has already been seen by Mental Health in the hospital and plans  for him to make contact with [facility] the day of  discharge.      Mila Homer. Sudie Bailey, M.D.  Electronically Signed     SDK/MEDQ  D:  03/23/2006  T:  03/23/2006  Job:  [id number]

## 2010-10-18 NOTE — Group Therapy Note (Signed)
   NAME:  [name], [name]                        ACCOUNT NO.:  [id number]   MEDICAL RECORD NO.:  [id number]                   PATIENT TYPE:  INP   LOCATION:  A218                                 FACILITY:  [facility]   PHYSICIAN:  Angus G. Renard Matter, M.D.              DATE OF BIRTH:  1928/05/08   DATE OF PROCEDURE:  06/16/2002  DATE OF DISCHARGE:                                   PROGRESS NOTE   SUBJECTIVE:  This patient was admitted with nausea, vomiting, and epigastric  pain secondary to alcohol gastritis.  He does have a history of erosive  esophagitis and possible Barrett's esophagus.  Apparently he had been having  chronic alcohol withdrawal, hallucinations.  History of hypertension,  hypokalemia.   OBJECTIVE:  VITAL SIGNS:  Blood pressure 137/77, respirations 20, pulse 106,  temperature 98.8.  HEART:  Regular.  LUNGS:  Clear to P&A.  ABDOMEN:  No palpable organs or masses.   ASSESSMENT:  The patient was admitted with nausea, vomiting, epigastric pain  secondary to alcoholic gastritis, possible underlying erosive esophagitis,  Barrett's esophagus, hypokalemia.   PLAN:  To continue current regimen.                                               Angus G. Renard Matter, M.D.    AGM/MEDQ  D:  06/16/2002  T:  06/16/2002  Job:  [id number]

## 2010-10-18 NOTE — Discharge Summary (Signed)
[name], [name]              ACCOUNT NO.:  [id number]   MEDICAL RECORD NO.:  [id number]          PATIENT TYPE:  INP   LOCATION:  A216                          FACILITY:  [facility]   PHYSICIAN:  Mila Homer. Sudie Bailey, M.D.DATE OF BIRTH:  1928/01/04   DATE OF ADMISSION:  03/10/2006  DATE OF DISCHARGE:  10/11/2007LH                                 DISCHARGE SUMMARY   HISTORY OF PRESENT ILLNESS:  This 77 year old was admitted to the hospital  with acute alcoholism and withdrawal syndrome.  He had a benign 3-day  hospitalization extending from March 10, 2006, to March 12, 2006, with  vital signs remaining stable.   LABORATORY DATA AND X-RAY FINDINGS:  Admission CBC and BMET were normal  except for glucose mildly elevated at 135.  His alcohol level was 242  (normal 0-10).  Drug screen and urine showed positive for benzodiazepines.  His magnesium level was 2.0.   HOSPITAL COURSE:  He was started on IV of normal saline at 150 mL an hour.  He was given thiamine 100 mg IV on the day of admission and a second dose  the following day.  He was put on Librium 25 mg four times a day which was  then switched to the Lorazepam protocol which was 2 mg q.6 h. the first day,  2 mg q.8 h. the second day and 1 mg q.8 h. the third day.  He did well on  this regimen and by his second day, his IV was decreased to 75 mL an hour.  His visual and auditory hallucinations, which he had the first day, had  cleared.  The third day, he continued to do well, was walking the hall,  seemed to be alert and oriented and was ready for discharge home.  IV was  discontinued.   DISCHARGE MEDICATIONS:  Lorazepam 1 mg, given maximum dose (100 with no  refills).   FOLLOW UP:  Followup early next week in 4-6 days.  Plans are going to be  made for him to be seen at [facility] and also to try and tie him  with some of the people his age in the county to avoid the social isolation  that has led to this.   DISCHARGE  DIAGNOSES:  1. Alcohol withdrawal syndrome.  2. Acute alcoholism.      Mila Homer. Sudie Bailey, M.D.  Electronically Signed     SDK/MEDQ  D:  03/12/2006  T:  03/14/2006  Job:  [id number]

## 2010-10-18 NOTE — H&P (Signed)
NAMECESARIO, WEIDINGER                        ACCOUNT NO.:  [id number]   MEDICAL RECORD NO.:  [id number]                   PATIENT TYPE:  IPS   LOCATION:  0300                                 FACILITY:  [facility]   PHYSICIAN:  Geoffery Lyons, M.D.                   DATE OF BIRTH:   DATE OF ADMISSION:  10/23/2002  DATE OF DISCHARGE:                         PSYCHIATRIC ADMISSION ASSESSMENT   IDENTIFYING INFORMATION:  This is a 77 year old white male who is widowed.  This is a voluntary admission.   HISTORY OF PRESENT ILLNESS:  This patient presented in the emergency room  after vomiting.  He had been drinking approximately three to four fifths of  wine daily for the past three weeks.  He says he has previously been sober  for the past five months.  He endorses prior admissions for detox.  He gives  little history today.  He denies current auditory or visual hallucinations.  He had been, prior to admission, complaining of seeing bugs and he has a  history of delirium tremens, last time in January 2004.  The patient reports  this episode of drinking has been going on for the past three weeks or so  and he had been given some Librium to try to stay off alcohol by Dr.  Sudie Bailey, his primary care physician.  He is unable to state any particular  stressor.  The patient's longest period sober has been twenty years from  19 to 2000.   PAST PSYCHIATRIC HISTORY:  This is the patient's third [facility] admission.  He has also been admitted to the medical unit for  detox, last time in January 2004, at which time his detox was complicated by  delirium and vomiting, electrolyte instability, and gastritis.  The patient  has no history of suicide attempt.   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  He has been widowed for approximately 3 years.  He  reports that he drinks alone, lives alone, no current legal charges.   FAMILY  HISTORY:  Not available.   ALCOHOL AND DRUG HISTORY:  The patient's history of alcohol abuse as noted  above.  He does have a history of chronic alcohol abuse with a lengthy  period, with a twenty year period of sobriety.  He denies any other  substance abuse.   MEDICAL HISTORY:  The patient is followed by Dr. Sudie Bailey in [place],  [place].  Medical problems include COPD, gastritis, alcohol induced.  The patient was diagnosed in January 2001 with duodenal erosions, pyloric  erosions, and esophageal erosions, and at that time had problems and on  detox had significant nausea, vomiting, electrolyte instability, and  delirium.   PAST MEDICAL HISTORY:  Remarkable for tonsillectomy, adenoidectomy, and a  prior appendectomy.   MEDICATIONS:  Librium 25 mg t.i.d.  We are not clear  if he has been  compliant with these.  He was also previously treated on prior admissions  wit Norvasc 10 mg daily, Protonix, and vitamins.   DRUG ALLERGIES:  ASA which bothers his stomach.   POSITIVE PHYSICAL FINDINGS:  The patient's physical exam was done at [facility] and is noted in the record.  It was generally  unremarkable, although he was given Zofran for nausea there.  At this time,  this is a fairly medium built, muscular, 77 year old male.  VITAL SIGNS:  Temperature 97.4.  Pulse 92.  Respirations 20.  Blood pressure  148/96.  He weighed 177 pounds on admission and is 5 ft. 10 inches tall.  He  is complaining of epigastric pain that radiates down into his lower abdomen.  He is mildly diaphoretic and mildly tremulous.  Otherwise, he demonstrates  symmetrical motor movements.  Facial symmetry is intact.  No other focal  neurological signs.   MENTAL STATUS EXAM:  This is an older, medium built, male who is feeling  quite miserable from what he calls a stomachache.  He says very little, one  or two word responses, yes or no, in response to questions.  He is generally  passively  cooperative.  Speech is within normal limits, no pressure.  Mood  is generally euthymic.  Thought process is logical.  His thought content is  dominated by his interest in getting something for his stomach ache and  alleviating his physical symptoms.  No evidence of psychosis.  No frank  homicidal or suicidal ideation.  Cognitively, he is intact and oriented x 3.   DIAGNOSES:   AXIS I:  ETOH abuse and dependence.   AXIS II:  No diagnosis.   AXIS III:  Gastritis, by history.   AXIS IV:  Deferred.   AXIS V:  29; past year 73.   PLAN:  Voluntarily admit the patient to detox him from alcohol.  We have  elected to start him on a Librium protocol which he is tolerating well.  We  are going to recheck CMET, amylase, and lipase level, and a total CK.  Meanwhile we will force gatorade, monitor him closely for symptoms of DTs  and evaluate him for symptoms of depression and psychosocial stressors after  he has made some better progress on detox and we can alleviate some of his  physical symptoms.  Meanwhile for his history of gastritis, we are going to  start him on Protonix 40 mg a.c. b.i.d., give him Carafate 1 gram to coat  his stomach and monitor his vomiting and his abdominal pain.  Estimated  length of stay is five to six days.     Margaret A. Scott, N.P.                   Geoffery Lyons, M.D.    MAS/MEDQ  D:  10/25/2002  T:  10/25/2002  Job:  [id number]

## 2010-10-18 NOTE — Discharge Summary (Signed)
NAMETHEODOR, [name]                        ACCOUNT NO.:  [id number]   MEDICAL RECORD NO.:  [id number]                   PATIENT TYPE:  INP   LOCATION:  A218                                 FACILITY:  [facility]   PHYSICIAN:  Mila Homer. Sudie Bailey, M.D.           DATE OF BIRTH:  November 01, 1927   DATE OF ADMISSION:  06/14/2002  DATE OF DISCHARGE:  06/19/2002                                 DISCHARGE SUMMARY   INTRODUCTION:  This 77 year old was admitted to the hospital with gastritis  secondary to ethyl alcoholism.  He had a fairly benign six-day  hospitalization extending from January 13 to June 19, 2002.  During this  time vital signs remained stable.   LABORATORY DATA:  His admission white cell count was 15,300, with 88  neutrophils, 4 lymphs.  H&H was 14/40.6.  His sodium was 132, potassium 2.7,  glucose 202, chloride 95.  Over the next few days his sodium went up to 136-  137, potassium to 3 and then to 2.8 to 3.6 on June 18, 2002.  At that  time his sodium was 137 with a glucose of 115.  On admission his AST was 47,  total bilirubin 3, direct bilirubin 0.6, indirect bilirubin 2.4, and lipase  55.  His admission CK was 580.  It went up to 620 and 852, but his MB was  only 4.9, his relative index low, and troponin normal.  Alcohol level on  admission was 1.  His H. pylori antibody was 0.36.   Chest x-ray showed COPD, mild chronic bronchitic changes.  EKG showed a  sinus tachycardia, rate 102.   HOSPITAL COURSE:  He was admitted to the hospital, put on IV D-5 half normal  saline at 200 cc/hr, thiamine 100 mg IV, Ativan 1 mg IV.  An O2 saturation  was checked which was 99%.  We then gave him KCl 4 mEq in juice immediately.  The hospitalist admitted him with diagnoses of gastritis and ETOH  withdrawal.  He was given Phenergan 25 mg IV q.6h., Protonix 40 mg IV daily,  IV normal saline with multivitamins per liter/100 cc/hr and thiamine 100 mg  p.o. daily, folate 1 mg daily, Librium  50 IV q.4h. p.r.n., and he was put on  seizure precautions, given Norvasc 10 mg daily, Paxil 20 mg q.a.m.  He was  also given magnesium sulfate 1 g in each buttock.  By later on the day of  admission his IV Librium was stopped.  He was put on Ativan 1 mg IV q.8h.   Arrangements were made for him to have an EGD, which did show duodenal  erosions.  He did require wrist, ankle, and waist restraints his second day,  had to be given Haldol 5 mg p.o.  This has to be repeated as IM dose.   Protonix was given 4 mg IV q.12h. after his EGD and switched to 40 b.i.d.  K-  Dur  was switched to 20 mEq daily then bumped to b.i.d. dose as well as KCl  10 mEq IV each hour at four doses.   He did well on this regimen.  He gradually improved.  Going back on the EGD,  this  showed florid changes, reflux esophagitis consistent with extensive  ulceration involving the distal __ cm of the esophagus with strictured GE  junction.  He has a more moderate-sized sliding hiatal hernia and a small  prepyloric ulcer.   His pathology report showed benign squamous gastric mucosa with moderate  diffuse acute and chronic inflammation.  His esophagus he did not have  Barrett's changes.   FINAL DIAGNOSES:  1. Alcoholic gastritis.  2. Erosive esophagitis secondary to vomiting.  3. Alcohol withdrawal syndrome.  4. Chronic ethyl alcoholism.  5. Electrolyte disturbances including hypokalemia and hyponatremia.  6. Hyperglycemia.  7. Depression.  8. Hallucinations as part of his alcohol withdrawal syndrome.   DISPOSITION:  He was discharged home by hospitalist.   MEDICATIONS:  1. Ativan 1 mg three times daily.  2. Norvasc 10 mg daily.  3. Protonix 40 mg b.i.d.  4. MVI q. daily.  5. Thiamine 100 mg 1 q. daily.  6. Folic acid 1 mg q. daily.   FOLLOW-UP:  He is to follow up with me in one to two weeks and with Dr.  Karilyn Cota, gastroenterologist, in two to three months.                                                Mila Homer. Sudie Bailey, M.D.    SDK/MEDQ  D:  10/01/2002  T:  10/01/2002  Job:  [id number]

## 2010-10-18 NOTE — Op Note (Signed)
NAMEIGNACIO, LOWDER                        ACCOUNT NO.:  [id number]   MEDICAL RECORD NO.:  [id number]                   PATIENT TYPE:  INP   LOCATION:  A218                                 FACILITY:  [facility]   PHYSICIAN:  Lionel December, M.D.                 DATE OF BIRTH:  07-24-1927   DATE OF PROCEDURE:  06/16/2002  DATE OF DISCHARGE:                                 OPERATIVE REPORT   PROCEDURE:  Esophagogastroduodenoscopy.   ENDOSCOPIST:  Lionel December, M.D.   INDICATIONS:  The patient is a 77 year old Caucasian male who is admitted  with nausea and vomiting and also had some hematemesis.  He has a history of  ulcerative reflux esophagitis.  Last endoscopy was in May 2003.  He was  advised to follow up in a few months so that Barrett's esophagus could be  ruled out.  Unfortunately, he did not keep his appointment, and he also has  not been taking any PPI.  He is also having alcohol withdrawal symptoms and  doing fairly well with therapy.  Informed consent for the procedure was  obtained from his power-of-attorney.   PREOPERATIVE MEDICATIONS:  Cetacaine spray for pharyngeal topical  anesthesia.  Demerol 50 mg IV, Versed 5 mg IV in divided dose.   INSTRUMENT:  Olympus video system.   DESCRIPTION OF PROCEDURE:  The procedure was performed in the endoscopy  suite.  The patient's vital signs and O2 saturations were monitored during  the procedure and remained stable.  The patient was placed in the left  lateral position.  The scope was passed through the oropharynx without any  difficulty into the esophagus.   Esophagus:  The mucosa of the proximal and middle segment was normal.  He  had a large ulcer involving the distal 5-6 cm.  Distally, also it was  somewhat deep and involved about how the circumference proximally.  It was  circumferential.  There was noncritical narrowing at the GE junction.  There  were some specks of blood covering the ulcer, but there was no active  bleeding.  He had a small to modest size sliding hiatal hernia.  Biopsy was  taken for routine cytology from the ulcer margin.   Stomach:  It was empty and distended without evidence of inflammation.  Folds in the proximal stomach are normal.  Examination of the mucosa  revealed a single small prepyloric ulcer without stigmata of bleeding.  The  pyloric channel was patent.  The angularis and fundus were examined by  retroflexing the scope and were normal.   Duodenum, examination of the bulb and second part of the duodenum was  normal.  The scope was withdrawn.   The patient tolerated the procedure.   FINAL DIAGNOSES:  1. Florid changes of reflux esophagitis consisting of extensive ulceration     involving the distal 6 cm of the esophagus.  Noncritical stricture at the  GE junction.  2. Small to moderate size sliding hiatal hernia.  Endoscopically appears to     be benign process.  Biopsy was taken for histology.  3. Small prepyloric ulcer.   RECOMMENDATIONS:  1. Continue PPI but increase the dose to b.i.d.  2. Hopefully he will be switched to p.o. in the next 24 hours or so.  3. H&H will be checked in the a.m.  4. We will discuss long-term he should live with daughter to make sure he     takes the medication when he goes home.                                               Lionel December, M.D.    NR/MEDQ  D:  06/16/2002  T:  06/16/2002  Job:  [id number]   cc:   Mila Homer. Sudie Bailey, M.D.  [address] [place], [place] [address]  Fax: [phone]

## 2010-10-18 NOTE — Consult Note (Signed)
[name], [name]                        ACCOUNT NO.:  [id number]   MEDICAL RECORD NO.:  [id number]                   PATIENT TYPE:  INP   LOCATION:  A218                                 FACILITY:  [facility]   PHYSICIAN:  Tana Coast, P.A.                  DATE OF BIRTH:  1927-10-03   DATE OF CONSULTATION:  06/15/2002  DATE OF DISCHARGE:                                   CONSULTATION   REFERRING PHYSICIAN:  [facility]ospitalists for Dr. Sudie Bailey.   CONSULTING PHYSICIAN:  Lionel December, M.D.   REASON FOR CONSULTATION:  Gastritis.   HISTORY OF PRESENT ILLNESS:  The patient is a 77 year old Caucasian male  with a history of alcohol abuse, admitted with gastritis and alcohol  withdrawal.  He gave a less than 24-hour history of multiple episodes of  nausea and vomiting, one episode of small-volume hematemesis, and epigastric  pain.  He also complains of heartburn for a while.  He complains of dry  cough and visual hallucinations.  He denies any melena, bright red blood per  rectum, diarrhea, constipation, dysphagia, and odynophagia.  The patient  reports that his last drink of alcohol was two days ago.  He generally  drinks two to three bottles of wine daily.  He has been in detoxification in  June of 2002 and May and July of 2003.  The patient denies any ill contacts.  He states that his appetite has been very well up until a few days ago.   On admission, the hemoglobin was 14, hematocrit 40.6, potassium 2.7, sodium  142, total bilirubin 3, indirect bilirubin 2.4, alkaline phosphatase 92,  SGOT 42, SGPT 29, albumin 3.9, and alcohol 1.   The patient is alert and oriented x 3 on examination today.  He states that  he continues to have visual hallucinations, however, they have lessened.  He  answered questions appropriately.  He declines on EGD today.  He states that  he is too nervous.  He is requesting colonoscopy given a family history of  colorectal cancer as well.   MEDICATIONS PRIOR  TO ADMISSION:  Per patient, none, but medications listed  are Paxil and Librium.   ALLERGIES:  ASPIRIN causes stomach pain.   PAST MEDICAL HISTORY:  1. Depression.  2. Chronic alcohol abuse with admissions for detoxification as outlined     above.  He had a period of sobriety from 43 to 1998.  He has a history     of DTs.  3. He had an EGD in May of 2003 by Lionel December, M.D., which revealed     ulcerative reflux esophagitis and small sliding hiatal hernia.  Barrett's     esophagus was not ruled out.  He had a Mallory-Weiss tear as well.  It     was recommended that he come in for a three-month follow-up, however, he     never contacted the office  after multiple attempts of reaching him.   PAST SURGICAL HISTORY:  1. Appendectomy.  2. Tonsillectomy.   FAMILY HISTORY:  His mother and father are in their 89s.  He takes care of  them.  He says that his mother had colon cancer approximately 10 years ago.   SOCIAL HISTORY:  He is widowed.  He is a retired Visual merchandiser.  He drinks two to  three bottles of wine daily.  His last drink was two days ago.  He quit  smoking in the remote past.   REVIEW OF SYSTEMS:  GASTROINTESTINAL:  Please see HPI.  GENERAL:  Denies any  weight loss.  CARDIOPULMONARY:  Denies any chest pain or shortness of  breath.  Complains of a dry, hacking cough.  NEUROLOGIC:  Complains of  visual hallucinations.   PHYSICAL EXAMINATION:  HEIGHT:  70.5 inches.  WEIGHT:  169.4 pounds.  VITAL SIGNS:  Temperature 98.1 degrees, pulse 105, respirations 20, blood  pressure 175/85.  GENERAL APPEARANCE:  A pleasant, well-nourished, well-developed, elderly,  Caucasian male in no acute distress.  He is alert and oriented to person,  place, and time.  SKIN:  Warm and dry.  Slightly jaundice.  HEENT:  The conjunctivae are pink.  The sclerae are mildly icteric.  Oropharyngeal mucosa moist and pink.  No lesion, erythema, or exudate.  Poor  dentition.  NECK:  No lymphadenopathy,  thyromegaly, or carotid bruits.  CHEST:  Lungs are clear to auscultation.  CARDIAC:  Regular rate and rhythm.  Normal S1 and S2.  No murmur, rub, or  gallop.  ABDOMEN:  Positive bowel sounds.  Soft, nontender, and nondistended.  No  organomegaly or masses.  No rebound tenderness or guarding.  EXTREMITIES:  No edema.   LABORATORY DATA:  On June 15, 2002, sodium 146, potassium 3, BUN 20,  creatinine 1.8, and glucose 108.  Total CK 580 and then 620, CK-MB 4.9 to  3.5 to 5.8, and troponin 0.02, 0.02, and 0.2.   IMPRESSION:  The patient is a 77 year old Caucasian male with alcohol abuse,  admitted with nausea, vomiting, and epigastric pain most likely secondary to  alcohol gastritis.  In May of 2002, EGD by Lionel December, M.D., revealed  erosive reflux esophagitis, possible Barrett's esophagus, and probably  Mallory-Weiss tear.  He failed to follow up for recommended three-month  follow-up EGD.  The nausea, vomiting, and epigastric pain are better today  per the patient.  He has eaten pancakes and eggs for breakfast and tolerated  this.  We recommend EGD primarily for surveillance purposes at this time,  but also diagnostic given nausea, vomiting, and an episode of hematemesis  (although most likely due to Mallory-Weiss tear or esophagitis).  The  patient is willing to undergo EGD, but states that he wants to wait until  tomorrow because he is too nervous.  He also needs a colonoscopy give a  family history of colorectal cancer.  He does desire this procedure.  He has  hyperbilirubinemia, most indirect, as well as slightly elevated SGOT.  He  may  have mild alcohol hepatitis.   SUGGESTIONS:  1. Continue IV Protonix 40 mg q.24h.  2. NPO past midnight.  3. Possible EGD tomorrow if the patient is agreeable.  4. Increase potassium supplement per the attending physician. 5. We will watch closely for signs of DTs.  The patient does appear nervous     and is having hallucinations.      Consider increasing Ativan.  6. Colonoscopy at a  later date.   I would like to thank Dr. Sudie Bailey for allowing [place] to take part in the care  of this patient.                                               Tana Coast, P.A.    LL/MEDQ  D:  06/15/2002  T:  06/15/2002  Job:  [id number]

## 2010-10-18 NOTE — Discharge Summary (Signed)
[name], ESTERLINE NO.:  [id number]   MEDICAL RECORD NO.:  [id number]          PATIENT TYPE:  IPS   LOCATION:  0507                          FACILITY:  [facility]   PHYSICIAN:  Jasmine Pang, M.D. DATE OF BIRTH:  1928/04/12   DATE OF ADMISSION:  03/20/2008  DATE OF DISCHARGE:  03/24/2008                               DISCHARGE SUMMARY   IDENTIFYING INFORMATION:  This is a 77 year old widowed Caucasian male  who was admitted on a voluntary basis on March 20, 2008.   HISTORY OF PRESENT ILLNESS:  This is one of several admissions for this  pleasant 77 year old gentleman with a history of alcohol abuse.  He has  a history of 20 years of sobriety, which ended in the year of following  his wife's death in Oct 30, 1997.  Since that time, he has been unable to string  together with significant amount of time with sobriety.  He has been  working with his primary care physician, Dr. Lacy Duverney who currently  has him on Librium 25 mg t.i.d.  He says that he is suffering from  depression and recognizes that he is lonely.  He feels that his  loneliness and isolation to persist drinking episodes.  He has been  drinking in binge pattern.  Last drank for 3 days ago, 2-1/2 pints of  wine.  He presented the emergency room requesting help for his  depression.  Before he drank any more.  He says has been drinking in a  binge pattern about once a week to every 5 days and feels unable to  stop.  He denies active suicidal intent based on his religious faith,  but admits that he feels so bad at times because not want to do anything  and just but just buried himself in alcohol.  He also has endorsed  decreased appetite.  When he is drinking and is unable to sleep at  night, unless he is intoxicated.  He denies homicidal thoughts.  He  denies confusion.  His primary support person is his sister.   PAST PSYCHIATRIC HISTORY:  He is not under any current psychiatric care.  He reports he is made  an appointment with mental health in [place],  [place] to see a counselor with [facility].  Last [facility] admission was in July of 2008.  He has also had several  admissions to our unit here in the past for alcohol detox and has had  several medical admissions for alcohol detox.  He has been involved in  AA in the past, which he abstinence for 20 years, which ended in 10/31/1998.  He is reluctant to return to AA because he feels that the discussion of  alcohol sometimes problems desire for it.   FAMILY HISTORY:  He denies any family history of mental illness or  substance abuse.   MEDICAL HISTORY:  COPD, hiccups for 3 days, hearing deficit, history of  GI bleed, Barrett esophagus.   CURRENT MEDICATIONS:  Librium 25 mg p.o. t.i.d. and Protonix 20 mg  daily.   DRUG ALLERGIES:  ASPIRIN.  PHYSICAL FINDINGS:  The patient's physical exam was done in the ED.  Prior to admission, there were no acute physical or medical problems  noted.   DIAGNOSTIC STUDIES:  Remarkable for urine drug screen positive for  benzodiazepines.  Alcohol level was less than 5.  Metabolic panel  revealed sodium of 134, potassium was 3.2, chloride of 99, carbon  dioxide of 26, BUN of 22, creatinine of 0.92.  Liver enzymes revealed  SGOT of 19, SGPT of 12, alkaline phosphatase of 96, and total bilirubin  was 2.1.  CBC revealed WBC of 7.2, hemoglobin of 12.4, hematocrit 37.4,  and platelets 219,000.   HOSPITAL COURSE:  Upon admission, the patient was placed on the Librium  detox protocol.  He was also started on Thorazine 25 mg p.o. t.i.d. for  24 hours and 25 mg t.i.d. p.r.n. hiccups.  The patient tolerated these  medications well with no significant side effects.  In individual  sessions with me, the patient was friendly and cooperative.  He stated  he does not drink daily.  He states he has been getting depressed for a  while.  He has not been able to pull out of this.  He denied suicidal   ideation.  He has had hiccups for the past 3 days and cannot get rid of  them.  He was driving a truck and doing well.  His sister brought him to  the ED.  His kids live in [place].  He states they did not work  for him, but he has some nondrinking friends.  He states he does not eat  while drinking.  On March 22, 2008, mental status was improving.  He  was less depressed, less anxious.  He was tolerating the detox protocol  well with no symptoms of withdrawal.  No side effects.  He stated he  felt fairly good.  He met with the social worker to discuss his plans  for discharge.  He planned to return to [facility] in [place] to see Dr.  Joni Reining.  On March 23, 2008, he continued with improve.  His sleep was  good.  Appetite was good.  He again was having no side effects from the  Librium detox protocol.  No symptoms of withdrawal.  On March 24, 2008, mental status had improved markedly from admission status.  The  patient was less depressed and less anxious.  Affect was consistent with  mood.  There was no suicidal or homicidal ideation.  No thoughts of self-  injurious behavior.  No auditory or visual hallucinations.  No paranoia  or delusions.  Thoughts were logical and goal-directed.  Thought content  no predominant theme.  Cognitive was grossly intact.  Insight good.  Judgment good.  Impulse control was good.  It was felt the patient was  safe for discharge today.   DISCHARGE DIAGNOSES:  Axis I:  Major depression, recurrent severe  without psychosis, alcohol dependence.  Axis II:  None.  Axis III:  Hiccups x3 days, chronic obstructive pulmonary disease,  Barrett esophagus, history of gastrointestinal bleed.  Axis IV:  Severe (issues with social isolation and protracted grief  after death of his spouse, burden of psychiatric illness, burden of  medical problems).  Axis V:  Global assessment of functioning was 50 upon discharge.  GAF  was 45 upon admission.  GAF highest past  year was 66.   DISCHARGE PLANS:  There was no dietary or activity level restrictions.   POSTHOSPITAL CARE PLANS:  The patient will see his primary care doctor,  Dr. Lacy Duverney and especially the hiccup continue to be an issue.  He  will also see Dr. Joni Reining, Arna Medici on November 23rd at 12:20  p.m.  He will come back to [facility] for  follow up appointment with Maxcine Ham our case manager on October  29th at 3 p.m.   DISCHARGE MEDICATIONS:  Protonix 20 mg daily and Librium was stopped.      Jasmine Pang, M.D.  Electronically Signed     BHS/MEDQ  D:  04/17/2008  T:  04/18/2008  Job:  [id number]

## 2010-10-18 NOTE — H&P (Signed)
[name], [name]                        ACCOUNT NO.:  [id number]   MEDICAL RECORD NO.:  [id number]                   PATIENT TYPE:  INP   LOCATION:  A218                                 FACILITY:  [facility]   PHYSICIAN:  Sarita Bottom, M.D.                  DATE OF BIRTH:  [birth year]   DATE OF ADMISSION:  06/14/2002  DATE OF DISCHARGE:                                HISTORY & PHYSICAL   CHIEF COMPLAINT:  I have been vomiting.   HISTORY OF PRESENT ILLNESS:  The patient is a 77 year old man with a history  of depression and chronic alcohol abuse.  He says he has been having nausea  and vomiting since this morning.  He states his vomitus ________________ and  has also been retching intermittently since this morning.  His vomitus is  nonbloody.  He also complains of severe epigastric pain and heartburn, and  his last drink, according to him, was yesterday afternoon.   REVIEW OF SYSTEMS:  He denies any recent weight loss or fever.  Complains of  slight shortness of breath.  Denies any cough.  He admits to having seen  some objects in the air, which are probably visual hallucinations.  He also  admits to depression but not suicidal.  All other systems reviewed and were  negative.   PAST MEDICAL HISTORY:  1. Depression, for which he was taking Paxil.  2. History of chronic alcohol abuse.  3. History of delirium tremens.   PAST SURGICAL HISTORY:  1. Appendectomy.  2. Tonsillitis.   MEDICATIONS:  He was taking Paxil for which he has not been compliant  because of affordability.  He also takes Librium for which he has run out.   ALLERGIES:  He says he is allergic to ASPIRIN which makes his stomach hurt.   FAMILY HISTORY:  His mother is alive and has diabetes mellitus.  His father  is also alive but had a stroke.   SOCIAL HISTORY:  He is a widower.  He has a daughter.  He is a retired  Visual merchandiser.  He drinks about two to three bottles of wine per day.  He does not  smoke  cigarettes.   PHYSICAL EXAMINATION:  VITAL SIGNS:  BP is 171/61, heart rate of 102.  GENERAL:  Elderly man lying comfortably on the stretcher.  He retches  intermittently.  He talks to himself sometimes.  HEENT:  He is not pale.  He has dry oral mucosa.  He is anicteric.  NECK:  Supple.  There is no lymphadenopathy.  There is no thyromegaly.  CHEST:  Air entry is good bilaterally.  No wheezes or crackles could be  heard.  CARDIOVASCULAR:  He is tachycardic.  His rhythm, however, is regular.  No  murmurs were heard.  ABDOMEN:  He has severe epigastric tenderness.  Bowel sounds are present.  No masses or organomegaly could be found.  CNS:  He is awake, alert, and oriented x 3.  He has slight tremors in his  upper extremities.  He hallucinates sometimes.  EXTREMITIES:  He does not have any pedal edema.   LABORATORY DATA:  Sodium of 152, potassium of 2.7, chloride of 95, CO2 of  32, BUN of 18, creatinine of 1.1, glucose of 202.  His calcium is 8.1.  His  WBC is 15.3, hemoglobin is 14, MCV is 88, platelet count is 163.  His LFTs  show normal SGOT and SGPT.  Total protein is 6.2.  Elevated total bilirubin  of 3.0 and indirect bilirubin of 2.4.   His EKG showed sinus tachycardia at 105.  Normal electrical axis.  He had  some small Q-waves in leads II, III, and aVF and in V5 and V6.  He has no  acute ST or T-wave changes.   ASSESSMENT AND PLAN:  1. Gastritis with dehydration:  The patient will be admitted under primary     M.D., Dr. Sudie Bailey.  He will be given IV Phenergan 25 mg q.6h. p.r.n. for     nausea.  He will also be given normal saline IV at 100 cc/hr.  I will     also give him Protonix 40 mg IV daily.  I will give him some Demerol for     the pain.  I will rule out MI with serial cardiac enzymes and EKG.  Will     also rule out pancreatitis by doing amylase and lipase.  2. History of chronic alcohol abuse and hallucinations:  He probably has     some alcohol withdrawal going on.   I will give him Librium 50 mg IV q.4h.     I will also give him thiamine 100 mg, folate 1 mg, and multivitamins.  He     will also be put on seizure precautions and delirium tremens precautions.  3. For the hypertension, the patient will be started on Norvasc 10 mg daily.     His blood pressure will be monitored, and his medications will be     adjusted accordingly.  4. For the hypokalemia, the patient will be given IV KCl 10 mEq x 4 doses.     We will also give him some magnesium sulfate at this time and check his     magnesium level.  We will monitor his BMETs tomorrow.  5. For the history of depression, he will be given Paxil 20 mg daily, and he     may need an outpatient counseling for alcohol abuse and also for     depression.   The patient will be admitted under primary M.D., Dr. Sudie Bailey.  Further  management and workup will depend on the patient's clinical course.  Note  being dictated by Dr. Sarita Bottom.  Coordination of care is more than 35  minutes.                                                 Sarita Bottom, M.D.    DW/MEDQ  D:  06/14/2002  T:  06/14/2002  Job:  [id number]

## 2010-10-18 NOTE — Group Therapy Note (Signed)
NAMESHIRLEY, BOLLE              ACCOUNT NO.:  [id number]   MEDICAL RECORD NO.:  [id number]          PATIENT TYPE:  INP   LOCATION:  A216                          FACILITY:  [facility]   PHYSICIAN:  Mila Homer. Sudie Bailey, M.D.DATE OF BIRTH:  06-01-1928   DATE OF PROCEDURE:  DATE OF DISCHARGE:  03/12/2006                                   PROGRESS NOTE   HISTORY OF PRESENT ILLNESS:  Says he is feeling a lot better today and wants  to go home.   OBJECTIVE:  GENERAL:  Temperature:  97.6.  Pulse:  77.  Respiratory rate:  20.  Blood pressure:  136/68.  GENERAL:  He is sitting on his bed, feet on the floor.  Color is good.  Appears to be alert.  No acute distress.  He is well developed, well  nourished.  HEART:  Irregular rhythm, rate of 70.  LUNGS:  Clear throughout.  ABDOMEN:  Soft without hepatosplenomegaly or mass or tenderness.  EXTREMITIES:  No edema in the ankles.  Appears alert today.  Less than 24 hours ago he was having visual and  auditory hallucinations in his room.   ASSESSMENT:  1. Alcohol withdrawal syndrome.  2. Chronic episodic ethyl alcohol abuse .   PLAN:  Continue Lorazepam.  Decrease the IV from 150 to 75 mL per hour.  Plan to discharge home tomorrow if he is stable.      Mila Homer. Sudie Bailey, M.D.  Electronically Signed     SDK/MEDQ  D:  03/11/2006  T:  03/13/2006  Job:  [id number]

## 2010-11-11 ENCOUNTER — Observation Stay (HOSPITAL_COMMUNITY)
Admission: EM | Admit: 2010-11-11 | Discharge: 2010-11-19 | Disposition: A | Discharge status: Rehabilitation Facility | Payer: Medicare HMO | Attending: Internal Medicine | Admitting: Internal Medicine

## 2010-11-11 DIAGNOSIS — R259 Unspecified abnormal involuntary movements: Secondary | ICD-10-CM | POA: Insufficient documentation

## 2010-11-11 DIAGNOSIS — R9431 Abnormal electrocardiogram [ECG] [EKG]: Secondary | ICD-10-CM | POA: Insufficient documentation

## 2010-11-11 DIAGNOSIS — Z79899 Other long term (current) drug therapy: Secondary | ICD-10-CM | POA: Insufficient documentation

## 2010-11-11 DIAGNOSIS — J449 Chronic obstructive pulmonary disease, unspecified: Secondary | ICD-10-CM | POA: Insufficient documentation

## 2010-11-11 DIAGNOSIS — J4489 Other specified chronic obstructive pulmonary disease: Secondary | ICD-10-CM | POA: Insufficient documentation

## 2010-11-11 DIAGNOSIS — Z860100 Personal history of colon polyps, unspecified: Secondary | ICD-10-CM | POA: Insufficient documentation

## 2010-11-11 DIAGNOSIS — R066 Hiccough: Secondary | ICD-10-CM | POA: Insufficient documentation

## 2010-11-11 DIAGNOSIS — K2270 Barrett's esophagus without dysplasia: Secondary | ICD-10-CM | POA: Insufficient documentation

## 2010-11-11 DIAGNOSIS — Z9089 Acquired absence of other organs: Secondary | ICD-10-CM | POA: Insufficient documentation

## 2010-11-11 DIAGNOSIS — D649 Anemia, unspecified: Secondary | ICD-10-CM | POA: Insufficient documentation

## 2010-11-11 DIAGNOSIS — H9190 Unspecified hearing loss, unspecified ear: Secondary | ICD-10-CM | POA: Insufficient documentation

## 2010-11-11 DIAGNOSIS — M1990 Unspecified osteoarthritis, unspecified site: Secondary | ICD-10-CM | POA: Insufficient documentation

## 2010-11-11 DIAGNOSIS — E876 Hypokalemia: Secondary | ICD-10-CM | POA: Insufficient documentation

## 2010-11-11 DIAGNOSIS — F341 Dysthymic disorder: Secondary | ICD-10-CM | POA: Insufficient documentation

## 2010-11-11 DIAGNOSIS — F10229 Alcohol dependence with intoxication, unspecified: Principal | ICD-10-CM | POA: Insufficient documentation

## 2010-11-11 DIAGNOSIS — D696 Thrombocytopenia, unspecified: Secondary | ICD-10-CM | POA: Insufficient documentation

## 2010-11-11 DIAGNOSIS — K5730 Diverticulosis of large intestine without perforation or abscess without bleeding: Secondary | ICD-10-CM | POA: Insufficient documentation

## 2010-11-11 DIAGNOSIS — E785 Hyperlipidemia, unspecified: Secondary | ICD-10-CM | POA: Insufficient documentation

## 2010-11-11 DIAGNOSIS — K219 Gastro-esophageal reflux disease without esophagitis: Secondary | ICD-10-CM | POA: Insufficient documentation

## 2010-11-11 LAB — RAPID URINE DRUG SCREEN, HOSP PERFORMED
Amphetamines: NOT DETECTED
Benzodiazepines: NOT DETECTED
Cocaine: NOT DETECTED
Opiates: NOT DETECTED
Tetrahydrocannabinol: NOT DETECTED

## 2010-11-11 LAB — COMPREHENSIVE METABOLIC PANEL
ALT: 19 U/L (ref 0–53)
Albumin: 3.2 g/dL — ABNORMAL LOW (ref 3.5–5.2)
Calcium: 7.7 mg/dL — ABNORMAL LOW (ref 8.4–10.5)
GFR calc Af Amer: >60 mL/min (ref >60)
Glucose, Bld: 95 mg/dL (ref 70–99)
Sodium: 137 mEq/L (ref 135–145)
Total Protein: 6.3 g/dL (ref 6.0–8.3)

## 2010-11-11 LAB — CBC
Hemoglobin: 13.4 g/dL (ref 13.0–17.0)
MCH: 28.5 pg (ref 26.0–34.0)
MCHC: 33.5 g/dL (ref 30.0–36.0)
Platelets: 235 10*3/uL (ref 150–400)
RDW: 14.8 % (ref 11.5–15.5)

## 2010-11-11 LAB — DIFFERENTIAL
Basophils Absolute: 0 10*3/uL (ref 0.0–0.1)
Basophils Relative: 0 % (ref 0–1)
Eosinophils Absolute: 0 10*3/uL (ref 0.0–0.7)
Eosinophils Relative: 0 % (ref 0–5)
Monocytes Absolute: 0.7 10*3/uL (ref 0.1–1.0)
Monocytes Relative: 8 % (ref 3–12)

## 2010-11-12 DIAGNOSIS — F4320 Adjustment disorder, unspecified: Secondary | ICD-10-CM

## 2010-11-12 LAB — POCT I-STAT, CHEM 8
BUN: 14 mg/dL (ref 6–23)
Chloride: 100 mEq/L (ref 96–112)
Glucose, Bld: 106 mg/dL — ABNORMAL HIGH (ref 70–99)
HCT: 43 % (ref 39.0–52.0)
Potassium: 3.6 mEq/L (ref 3.5–5.1)

## 2010-11-13 ENCOUNTER — Observation Stay (HOSPITAL_COMMUNITY): Payer: Medicare HMO

## 2010-11-13 LAB — CBC
HCT: 38.7 % — ABNORMAL LOW (ref 39.0–52.0)
Hemoglobin: 12.7 g/dL — ABNORMAL LOW (ref 13.0–17.0)
MCHC: 32.8 g/dL (ref 30.0–36.0)
WBC: 5.1 10*3/uL (ref 4.0–10.5)

## 2010-11-13 LAB — BASIC METABOLIC PANEL
BUN: 16 mg/dL (ref 6–23)
Chloride: 100 mEq/L (ref 96–112)
Glucose, Bld: 95 mg/dL (ref 70–99)
Potassium: 3.3 mEq/L — ABNORMAL LOW (ref 3.5–5.1)

## 2010-11-13 NOTE — H&P (Signed)
NAMEYIFAN, AUKER NO.:  [id number]  MEDICAL RECORD NO.:  [id number]  LOCATION:  1512                         FACILITY:  [facility]  PHYSICIAN:  Hillery Aldo, M.D.   DATE OF BIRTH:  January 20, 1928  DATE OF ADMISSION:  11/11/2010 DATE OF DISCHARGE:                             HISTORY & PHYSICAL   PRIMARY CARE PHYSICIAN:  [employer].  CHIEF COMPLAINT:  "I need detox."  HISTORY OF PRESENT ILLNESS:  Patient is an 77 year old male, who has suffered with alcoholism for much of his adult life.  He did retain sobriety for approximately 20 years and relapsed recently after his brother died of cancer.  The patient states that he has been drinking two-fifth bottles of wine as well as up to 20 beers a day.  He initially thought he could control his drinking, but has subsequently discovered that he is now unable to control it.  Presented to the Emergency Department requesting detoxification and while there, developed nausea, tremors, and dry heaves.  The patient admits to significant loneliness and social isolation, but denies frank suicidal ideation.  His nausea is currently better after spending the night in the Emergency Department and being treated with IV fluids and antiemetics.  PAST MEDICAL HISTORY: 1. Anemia. 2. Gastroesophageal reflux disease/Barrett esophagus. 3. Dyslipidemia. 4. History of septic right knee with cultures positive for MRSA,     status post debridement, lavage, and partial medial meniscectomy 5. COPD. 6. Chronic hiccups. 7. Depression. 8. Osteoarthritis. 9. Hearing impairment. 10.Status post cardiac catheterization, which was reportedly negative     for coronary disease in April 2011. 11.Diverticulosis. 12.Colonic polyps, status post polypectomy. 13.History of upper GI bleeding.  PAST SURGICAL HISTORY: 1. Right knee surgery as described above. 2. Appendectomy. 3. Tonsillectomy. 4. Bilateral cataracts with lens  implants.  ALLERGIES:  ASPIRIN.  CURRENT MEDICATIONS: 1. Omeprazole 20 mg p.o. daily. 2. Lorazepam 1 mg p.o. q.i.d. p.r.n. withdrawal symptoms.  SOCIAL HISTORY:  Patient is widowed.  He has worked as in various occupations.  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  Most recently, he has kept a farm.  He has a history of heavy intermittent alcoholism with 20 years of sobriety and recent relapse.  He quit tobacco smoking approximately 49 years ago.  Denies drug abuse.  He has several children, but they are scattered and do not live close to him.  FAMILY HISTORY:  Patient's mother died in her 90s from complications of Alzheimer disease, she had a history of colon cancer and diabetes.  The patient's father died in his 12s of leukemia, but had a history of stroke.  One brother recently deceased of cancer of uncertain etiology. He has a sister, who is alive in her 69s and has unspecified bladder issues.  REVIEW OF SYSTEMS:  CONSTITUTIONAL:  No fever, chills, weight loss or appetite changes. HEENT::  No complaints. CARDIOVASCULAR:  No chest pain or dysrhythmia. RESPIRATORY:  No shortness of breath or cough. GI::  Recent nausea and vomiting, currently resolved.  No diarrhea, melena or hematochezia. GU:  No dysuria or hematuria. MUSCULOSKELETAL:  No current complaints.  Comprehensive  14-point review of systems is otherwise unremarkable.  PHYSICAL EXAMINATION:  VITAL SIGNS:  Temperature 97.7, blood pressure 157/81, pulse 101, respirations 20, O2 saturation 98% on room air. GENERAL:  Well-developed, well-nourished Caucasian male, in no acute distress. HEENT:  Normocephalic, atraumatic.  PERRL.  EOMI.  Sclerae nonicteric. Oropharynx is clear with fair dentition. NECK:  Supple, no thyromegaly, no lymphadenopathy, no jugular venous distention. CHEST:  Lungs clear to auscultation bilaterally with good air movement. HEART:   Tachycardic rate, regular rhythm.  No murmurs, rubs or gallops. ABDOMEN:  Soft, nontender, nondistended with normoactive bowel sounds. EXTREMITIES:  No clubbing, edema or cyanosis. SKIN:  Warm and dry.  No rashes. NEUROLOGIC:  Patient is alert and oriented x3.  Cranial nerves II through XII are grossly intact.  Moves all extremities x4 with equal strength with mild tremulousness.  DATA REVIEW:  Sodium is 135, potassium 3.6, chloride 100, bicarb 23, BUN 14, creatinine 0.9, glucose 106.  Alcohol level was 120 on admission. Liver function studies were within normal limits with the exception of mildly low albumin at 3.2.  White blood cell count is 9.3, hemoglobin 13.4, hematocrit 40, platelets 235,000.  Urine drug screen was negative.  ASSESSMENT AND PLAN: 1. Alcohol dependency/withdrawal:  We will admit the patient and detox     him with Ativan per the [facility] Withdrawal Assessment     protocol.  We will supplement him with thiamine and folic acid and     obtain psychiatric referral for further placement options. 2. Hypokalemia:  Patient has been repleted with oral potassium.  We     will also check a magnesium level in the morning. 3. Gastroesophageal reflux disease/Barrett esophagus:  We will place     the patient on proton pump inhibitor therapy. 4. Prophylaxis:  Patient is currently ambulatory with low risk for     deep venous thrombosis and we will encourage early ambulation for     deep venous thrombosis prophylaxis and PAS hoses when in bed for     deep venous thrombosis prophylaxis.  Time spent on admission including face-to-face time is approximately 1 hour.     Hillery Aldo, M.D.     CR/MEDQ  D:  11/12/2010  T:  11/12/2010  Job:  [id number]  cc:   Olena Leatherwood [facility]  Electronically Signed by Hillery Aldo M.D. on 11/13/2010 12:44:31 PM

## 2010-11-14 LAB — CBC
HCT: 39 % (ref 39.0–52.0)
Hemoglobin: 12.6 g/dL — ABNORMAL LOW (ref 13.0–17.0)
MCV: 88.6 fL (ref 78.0–100.0)
RBC: 4.4 MIL/uL (ref 4.22–5.81)
WBC: 6.6 10*3/uL (ref 4.0–10.5)

## 2010-11-14 LAB — BASIC METABOLIC PANEL
BUN: 18 mg/dL (ref 6–23)
CO2: 24 mEq/L (ref 19–32)
Chloride: 103 mEq/L (ref 96–112)
Creatinine, Ser: 1.02 mg/dL (ref 0.4–1.5)

## 2010-11-19 NOTE — Discharge Summary (Signed)
  [name], [name] NO.:  [id number]  MEDICAL RECORD NO.:  [id number]  LOCATION:  1512                         FACILITY:  [facility]  PHYSICIAN:  Andreas Blower, MD       DATE OF BIRTH:  May 17, 1928  DATE OF ADMISSION:  11/11/2010 DATE OF DISCHARGE:                        DISCHARGE SUMMARY - REFERRING   ADDENDUM:  Please refer to discharge summary dictated by Dr. Blake Divine on November 16, 2010, for more details.  DISCHARGE DIAGNOSIS:  Please refer to discharge summary dictated on November 16, 2010.  Unchanged since the time. 1. Alcohol intoxication. 2. Anemia. 3. Gastroesophageal reflux disease/Barrett's esophagus. 4. Hyperlipidemia. 5. History of chronic obstructive pulmonary disease. 6. Depression. 7. Osteoarthritis. 8. Anxiety. 9. Hearing impairment. 10.History of diverticulosis. 11.History of septic right knee, left. 12.History of methicillin-resistant Staphylococcus aureus, status post     debridement and partial medial meniscectomy. 13.History of upper gastrointestinal bleed. 14.History of colon polyp. 15.History of cardiac catheterization in April of 2012, which was     negative. 16.History of appendectomy. 17.History of tonsillectomy. 18.Hypokalemia. 19.Mild thrombocytopenia.  DISCHARGE MEDICATIONS: 1. Thiamine 100 mg p.o. daily. 2. Folic acid 1 mg p.o. daily. 3. Multivitamin 1 tablet p.o. daily. 4. Lorazopam 1 mg p.o. twice daily as needed. 5. Tylenol 650 mg p.o. q.6 hours as needed. 6. Omeprazole 200 mg p.o. daily.  BRIEF HOSPITAL COURSE:  Please refer to discharge summary dictated on November 12, 2010, for more details.  Hospital course is unchanged since November 16, 2010.  The patient is to be discharged to [facility] for detox rehab.  Time spent on discharge of the patient and coordinating care was less than 25 minutes.     Andreas Blower, MD     SR/MEDQ  D:  11/19/2010  T:  11/19/2010  Job:  [id number]  Electronically Signed by Wardell Heath Fernando Stoiber  on  11/19/2010 08:40:08 PM

## 2010-11-19 NOTE — Consult Note (Signed)
  NAMEREFUJIO, [name] NO.:  [id number]  MEDICAL RECORD NO.:  [id number]  LOCATION:  WLED                         FACILITY:  [facility]  PHYSICIAN:  Eulogio Ditch, MD DATE OF BIRTH:  23-May-1928  DATE OF CONSULTATION:  11/12/2010 DATE OF DISCHARGE:                                CONSULTATION   REASON FOR CONSULTATION:  Alcohol abuse.  HISTORY OF PRESENT ILLNESS:  This is an 77 year old male who is abusing alcohol, mainly the wine and beer after remaining sober for 20 years. The patient still has tremors in his hands.  The patient has a history of DTs and hallucinations in the past.  The patient still at this time is confused, unable to make a good conversation.  The patient has no history of any suicide attempt in the past.  The patient denies any other drug abuse.  PAST MEDICAL HISTORY:  History of DTs in the past.  ALLERGIES:  Allergic to ASPIRIN.  MENTAL STATUS EXAMINATION:  The patient is calm, cooperative during the interview, but anxious.  Fair eye contact, still has tremors in the hands, unable to make a good conversation, but is not hallucinating or delusional.  Not suicidal or homicidal.  Alert, awake, oriented x3. Memory, immediate, recent and remote fair.  Attention and concentration poor.  Abstraction ability poor.  Insight and judgment fair.  DIAGNOSES:  Axis I:  Chronic alcohol abuse. Axis II:  Deferred. Axis III:  Hard of hearing. Axis IV:  Alcohol abuse and still in bereavement over the death of the brother who died because of cancer. Axis V:  40.  RECOMMENDATIONS: 1. The patient can be continued on CIWA protocol. 2. I will work with a clinical Child psychotherapist to find a rehab place for     the patient.  Thanks for involving me in taking care of this patient.     Eulogio Ditch, MD     SA/MEDQ  D:  11/12/2010  T:  11/12/2010  Job:  [id number]  Electronically Signed by Eulogio Ditch  on 11/19/2010 09:55:40 AM

## 2010-11-25 NOTE — Discharge Summary (Signed)
[name], [name] NO.:  [id number]  MEDICAL RECORD NO.:  [id number]  LOCATION:  1512                         FACILITY:  [facility]  PHYSICIAN:  Kathlen Mody, MD       DATE OF BIRTH:  [birth year]  DATE OF ADMISSION:  11/11/2010 DATE OF DISCHARGE:                              DISCHARGE SUMMARY   DISCHARGE DIAGNOSES: 1. Alcohol intoxication. 2. Anemia. 3. Gastroesophageal reflux disease/Barrett esophagus. 4. Dyslipidemia. 5. History of chronic obstructive pulmonary disease. 6. Depression. 7. Osteoarthritis 8. Hearing impairment. 9. Diverticulosis. 10.History of septic right knee with history of methicillin-resistant     staphylococcus aureus status post debridement and partial medial     meniscectomy. 11.History of upper gastrointestinal bleed. 12.History of colon polyp. 13.History of cardiac catheterization in April 2012 which was     negative. 14.History of appendectomy. 15.History of tonsillectomy. 16.Hypokalemia. 17.Mild thrombocytopenia.  DISCHARGE MEDICATIONS: 1. Thiamine 100 mg 1 tablet daily. 2. Folic acid 1 mg 1 tablet daily. 3. Multivitamin 1 tablet daily. 4. Lorazepam 1 mg 1 tablet twice a day as needed. 5. Tylenol 650 mg p.o. q.4 h as needed. 6. Omeprazole 20 mg 1 tab p.o. daily.  PERTINENT LABS:  The patient had a urine drug screen which was negative. CBC on admission which was within normal limits.  Comprehensive metabolic panel on admission showed a potassium of 2.9, bicarb of 16. Liver function tests within normal limits.  Alcohol level was 120. Repeat CBC showed platelet count of 137, hemoglobin of 12.7, hematocrit of 38.7.  Basic metabolic panel repeat potassium showed 3.3, magnesium was 2.4.  Repeat CBC showed a hemoglobin of 12.6, hematocrit of 39, platelets of 129.  Basic metabolic panel within normal limits.  CONSULTS CALLED:  A psychiatric consult was called from Dr. Rogers Blocker.  PROCEDURES:  None.  BRIEF HOSPITAL COURSE:   This is an 77 year old gentleman with multiple medical problems who has a history of alcohol abuse, has been sober for almost 20 years, relapsed recently after his brother died of cancer, so he was admitted for alcohol withdrawal symptoms.  He was put on CIWA protocol with Ativan IV, then transitioned to p.o. Ativan.  At this time, the patient does not have any symptoms of alcohol withdrawal.  He was also started on thiamine and folic acid supplements and psychiatric consult was called who recommended the same and possible placement for detox.  The patient had a PT, OT evaluation, recommended detox rehab. He has a bed and is on the waiting list for [facility] and he will probably be discharged on Tuesday.  Hypokalemia, repleted and potassium is normal.  GERD/Barrett esophagus.  The patient on omeprazole 20 mg daily. Continue the same.  Mild thrombocytopenia, stable.  PHYSICAL EXAMINATION:  VITAL SIGNS:  The patient's vitals as of today include temperature of 97.5, pulse of 91, blood pressure 140/75, respirations 16, saturating 95 to 99% on room air. GENERAL EXAM:  He is alert, afebrile, oriented x3, comfortable, in no acute distress. CARDIOVASCULAR EXAM:  S1 and S2 heard. RESPIRATORY EXAM:  Chest clear to auscultation bilaterally. ABDOMEN:  Soft, nontender, nondistended.  Bowel sounds are heard. EXTREMITIES:  No pedal edema. NEUROLOGICAL EXAM:  No focal  deficits.  The patient is hemodynamically stable for discharge, awaiting bed at [facility] detox rehab.  The patient is recommended to follow with the physician at detox as needed.          ______________________________ Kathlen Mody, MD     VA/MEDQ  D:  11/16/2010  T:  11/16/2010  Job:  [id number]  Electronically Signed by Kathlen Mody MD on 11/25/2010 02:28:43 AM

## 2011-02-05 ENCOUNTER — Other Ambulatory Visit: Payer: Self-pay

## 2011-02-05 ENCOUNTER — Encounter (HOSPITAL_COMMUNITY): Payer: Self-pay | Admitting: Emergency Medicine

## 2011-02-05 ENCOUNTER — Emergency Department (HOSPITAL_COMMUNITY)
Admission: EM | Admit: 2011-02-05 | Discharge: 2011-02-05 | Disposition: A | Discharge status: Home / Self Care | Payer: Medicare HMO | Attending: Emergency Medicine | Admitting: Emergency Medicine

## 2011-02-05 DIAGNOSIS — K219 Gastro-esophageal reflux disease without esophagitis: Secondary | ICD-10-CM | POA: Insufficient documentation

## 2011-02-05 DIAGNOSIS — H8110 Benign paroxysmal vertigo, unspecified ear: Secondary | ICD-10-CM

## 2011-02-05 DIAGNOSIS — Z87891 Personal history of nicotine dependence: Secondary | ICD-10-CM | POA: Insufficient documentation

## 2011-02-05 HISTORY — DX: Panic disorder (episodic paroxysmal anxiety): F41.0

## 2011-02-05 HISTORY — DX: Dizziness and giddiness: R42

## 2011-02-05 HISTORY — DX: Gastro-esophageal reflux disease without esophagitis: K21.9

## 2011-02-05 HISTORY — DX: Pain in unspecified knee: M25.569

## 2011-02-05 MED ORDER — LORAZEPAM 1 MG PO TABS
1.0000 mg | ORAL_TABLET | Freq: Once | ORAL | Status: AC
  Administered 2011-02-05: 1 mg via ORAL
  Filled 2011-02-05: qty 1

## 2011-02-05 MED ORDER — LORAZEPAM 1 MG PO TABS: 1.0000 mg | ORAL_TABLET | Freq: Three times a day (TID) | ORAL | Status: AC | PRN

## 2011-02-05 NOTE — ED Notes (Signed)
Sinus rhythm, alert, no distress.

## 2011-02-05 NOTE — ED Provider Notes (Signed)
History     CSN: [id number] Arrival date & time: 02/05/2011  5:54 PM  Chief Complaint  Patient presents with  . Dizziness   HPI patient has had dizziness off and on for several months. Dizziness is worse with standing and walking. No significant change in symptoms. He has been prescribed Ativan for same which helps. He has run out of his Ativan. He is between doctors at this point. No new neurological deficits. No fever, chills, dysuria, arm or leg weakness, confusion. He does not want an extensive workup tonight. He feels it starting his Ativan again will help immensely.  Past Medical History  Diagnosis Date  . Knee pain   . Dizzy   . Panic attacks   . Acid reflux     Past Surgical History  Procedure Date  . Appendectomy   . Tonsillectomy   . Bilateral cataract surgery   . Knee surgery   . Cardiac catheterization     History reviewed. No pertinent family history.  History  Substance Use Topics  . Smoking status: Former Games developer  . Smokeless tobacco: Not on file  . Alcohol Use: Yes     occasional with binge drinking      Review of Systems  All other systems reviewed and are negative.    Physical Exam  BP 155/77  Pulse 79  Temp(Src) 98.4 F (36.9 C) (Oral)  Resp 20  Ht 5\' 10"  (1.778 m)  Wt 180 lb (81.647 kg)  BMI 25.83 kg/m2  SpO2 98%  Physical Exam  Nursing note and vitals reviewed. Constitutional: He is oriented to person, place, and time. He appears well-developed and well-nourished.  HENT:  Head: Normocephalic and atraumatic.  Eyes: Conjunctivae and EOM are normal. Pupils are equal, round, and reactive to light.  Neck: Normal range of motion. Neck supple.  Cardiovascular: Normal rate and regular rhythm.   Pulmonary/Chest: Effort normal and breath sounds normal.  Abdominal: Soft. Bowel sounds are normal.  Musculoskeletal: Normal range of motion.  Neurological: He is alert and oriented to person, place, and time.  Skin: Skin is warm and dry.    Psychiatric: He has a normal mood and affect.    ED Course  Procedures  MDM Patient has no neurological deficits. Ran out of Ativan 2 days ago. Has appointment with [facility]. Does not want CT scan of head. Will refill Ativan      Donnetta Hutching, MD 02/05/11 2117

## 2011-02-05 NOTE — ED Notes (Signed)
Alert, no distress.  Says he feels better,

## 2011-02-05 NOTE — ED Notes (Signed)
Pt c/o frequent dizzy spells for about a month and has been unable to see his pcp.

## 2011-02-05 NOTE — ED Notes (Signed)
Pt c/o intermittant dizziness  For months. This episode started today. Pt is steady on feet and ambulatory. nad at this time. Denies dizziness/pain/sob or any other sx's at this time. Alert/oriented. Has been off a lot of his meds x 4 months due to insurance. Has not had ativan x 2 days and scared he may have panic attack or withdrawels. No sx's at present.

## 2011-02-19 LAB — COMPREHENSIVE METABOLIC PANEL
Alkaline Phosphatase: 95
BUN: 7
Calcium: 8.2 — ABNORMAL LOW
Glucose, Bld: 111 — ABNORMAL HIGH
Potassium: 2.8 — ABNORMAL LOW
Total Protein: 6.4

## 2011-02-19 LAB — CBC
HCT: 39
Hemoglobin: 13.1
MCHC: 33.4
MCV: 83.6
RDW: 15.7 — ABNORMAL HIGH

## 2011-02-19 LAB — POCT CARDIAC MARKERS
Operator id: 267321
Troponin i, poc: <0.05

## 2011-02-19 LAB — DIFFERENTIAL
Basophils Relative: 1
Monocytes Relative: 14 — ABNORMAL HIGH
Neutro Abs: 2.2
Neutrophils Relative %: 52

## 2011-02-19 LAB — POTASSIUM: Potassium: 3.7

## 2011-02-21 ENCOUNTER — Ambulatory Visit (INDEPENDENT_AMBULATORY_CARE_PROVIDER_SITE_OTHER): Payer: Medicare HMO | Admitting: Family Medicine

## 2011-02-21 ENCOUNTER — Encounter: Payer: Self-pay | Admitting: Family Medicine

## 2011-02-21 VITALS — BP 130/70 | HR 97 | Resp 16 | Ht 70.0 in | Wt 183.1 lb

## 2011-02-21 DIAGNOSIS — K219 Gastro-esophageal reflux disease without esophagitis: Secondary | ICD-10-CM

## 2011-02-21 DIAGNOSIS — F411 Generalized anxiety disorder: Secondary | ICD-10-CM

## 2011-02-21 DIAGNOSIS — F419 Anxiety disorder, unspecified: Secondary | ICD-10-CM

## 2011-02-21 DIAGNOSIS — F1010 Alcohol abuse, uncomplicated: Secondary | ICD-10-CM

## 2011-02-21 NOTE — Patient Instructions (Signed)
I will get records from Dr. Sudie Bailey, or you can pick up records yourself We will get records from the rehab center I advise your to quit drinking. Use the Ativan prescription you already have Continue your vitamins  Next visit in 4 weeks to discuss your sleep

## 2011-02-21 NOTE — Progress Notes (Signed)
  Subjective:    Patient ID: [name], male    DOB: [birth year], 77 y.o.   MRN: [id number]  HPI Previous PCP Dr. Sudie Bailey, pt here to establish care , his insurance changed   Anxiety- was previously on libirum, had been on xanax, has been on Ativan for the past few years, if he doesn't take his medication he gets panic attacks, he feels his meds need to be changed, does not sleep well and wakes up depressed  Was in Rehab at [facility] a few months ago- for alcohol   Alcoholism- Quit alcohol in 1980, now he drinks on sprees every few months Quit tobacco at age 59   Review of Systems     Objective:   Physical Exam GEN- NAD, alert and oriented x3, thin elderly male HEENT- PERRL, EOMI, , MMM, oropharynx clear Neck- Supple, no carotid bruit CVS- RRR, no murmur RESP-CTAB EXT- No edema Pulses- Radial, DP- 2+ Psych- pleasant, not overly depressed or anxious appearing       Assessment & Plan:

## 2011-02-23 ENCOUNTER — Encounter: Payer: Self-pay | Admitting: Family Medicine

## 2011-02-23 DIAGNOSIS — F419 Anxiety disorder, unspecified: Secondary | ICD-10-CM | POA: Insufficient documentation

## 2011-02-23 DIAGNOSIS — K219 Gastro-esophageal reflux disease without esophagitis: Secondary | ICD-10-CM | POA: Insufficient documentation

## 2011-02-23 NOTE — Assessment & Plan Note (Signed)
Obtain records from recent admission. Pt denies continuous drinking. Hospital note states recent relapse after his brother died. Note he is not following with psych as recommended. Continue vitamins

## 2011-02-23 NOTE — Assessment & Plan Note (Signed)
H/O GI bleed, and barretts continue PPI, pt buys OTC

## 2011-02-23 NOTE — Assessment & Plan Note (Signed)
Pt likely has more extensive history he is not letting on about. I reviewed EPIC he had more medical history than anxiety and occ etoh use. He just received a prescription from his previous PCP for Ativan which he has not filled yet. I will not change his medication or refill at this time.

## 2011-02-24 LAB — DIFFERENTIAL
Basophils Absolute: 0.1
Eosinophils Absolute: 0
Lymphs Abs: 0.5 — ABNORMAL LOW
Monocytes Absolute: 0.4

## 2011-02-24 LAB — CBC
HCT: 42.7
MCHC: 32.9
MCV: 81
Platelets: 222
RDW: 17 — ABNORMAL HIGH

## 2011-02-24 LAB — BASIC METABOLIC PANEL
BUN: 13
CO2: 25
Chloride: 103
GFR calc non Af Amer: >60
Glucose, Bld: 129 — ABNORMAL HIGH
Potassium: 3.7

## 2011-02-25 LAB — BASIC METABOLIC PANEL
BUN: 22
Calcium: 9.1
Creatinine, Ser: 1.03
GFR calc Af Amer: >60

## 2011-02-25 LAB — CBC
HCT: 36 — ABNORMAL LOW
MCHC: 34.3
Platelets: 205
RDW: 17.8 — ABNORMAL HIGH

## 2011-02-25 LAB — DIFFERENTIAL
Basophils Absolute: 0
Basophils Relative: 1
Eosinophils Relative: 0
Lymphocytes Relative: 19
Neutro Abs: 7.5

## 2011-02-25 LAB — LIPASE, BLOOD: Lipase: 30

## 2011-02-25 LAB — ETHANOL: Alcohol, Ethyl (B): <5

## 2011-02-25 LAB — AMYLASE: Amylase: 60

## 2011-03-03 ENCOUNTER — Emergency Department (HOSPITAL_COMMUNITY)
Admission: EM | Admit: 2011-03-03 | Discharge: 2011-03-03 | Disposition: A | Discharge status: Home / Self Care | Payer: Medicare HMO | Attending: Emergency Medicine | Admitting: Emergency Medicine

## 2011-03-03 ENCOUNTER — Encounter (HOSPITAL_COMMUNITY): Payer: Self-pay | Admitting: Emergency Medicine

## 2011-03-03 ENCOUNTER — Other Ambulatory Visit: Payer: Self-pay

## 2011-03-03 DIAGNOSIS — H532 Diplopia: Secondary | ICD-10-CM | POA: Insufficient documentation

## 2011-03-03 DIAGNOSIS — K219 Gastro-esophageal reflux disease without esophagitis: Secondary | ICD-10-CM | POA: Insufficient documentation

## 2011-03-03 DIAGNOSIS — R42 Dizziness and giddiness: Secondary | ICD-10-CM | POA: Insufficient documentation

## 2011-03-03 DIAGNOSIS — F1021 Alcohol dependence, in remission: Secondary | ICD-10-CM | POA: Insufficient documentation

## 2011-03-03 DIAGNOSIS — Z7982 Long term (current) use of aspirin: Secondary | ICD-10-CM | POA: Insufficient documentation

## 2011-03-03 DIAGNOSIS — H81399 Other peripheral vertigo, unspecified ear: Secondary | ICD-10-CM

## 2011-03-03 DIAGNOSIS — I440 Atrioventricular block, first degree: Secondary | ICD-10-CM | POA: Insufficient documentation

## 2011-03-03 DIAGNOSIS — Z8614 Personal history of Methicillin resistant Staphylococcus aureus infection: Secondary | ICD-10-CM | POA: Insufficient documentation

## 2011-03-03 LAB — CBC
HCT: 37.4 — ABNORMAL LOW
Hemoglobin: 12.4 — ABNORMAL LOW
MCH: 28.7 pg (ref 26.0–34.0)
MCV: 87.7 fL (ref 78.0–100.0)
MCV: 82.2
Platelets: 191 10*3/uL (ref 150–400)
Platelets: 219
RBC: 4.46 MIL/uL (ref 4.22–5.81)
RDW: 14.1 % (ref 11.5–15.5)
RDW: 21.3 — ABNORMAL HIGH
WBC: 7.2

## 2011-03-03 LAB — DIFFERENTIAL
Basophils Absolute: 0 10*3/uL (ref 0.0–0.1)
Basophils Relative: 1 % (ref 0–1)
Eosinophils Absolute: 0.2 10*3/uL (ref 0.0–0.7)
Eosinophils Relative: 3 % (ref 0–5)
Lymphs Abs: 1.4 10*3/uL (ref 0.7–4.0)
Neutrophils Relative %: 62 % (ref 43–77)

## 2011-03-03 LAB — COMPREHENSIVE METABOLIC PANEL
Albumin: 4
Alkaline Phosphatase: 96
BUN: 22
Chloride: 99
Creatinine, Ser: 0.92
Glucose, Bld: 109 — ABNORMAL HIGH
Potassium: 3.2 — ABNORMAL LOW
Total Bilirubin: 2.1 — ABNORMAL HIGH

## 2011-03-03 LAB — POCT I-STAT TROPONIN I: Troponin i, poc: 0 ng/mL (ref 0.00–0.08)

## 2011-03-03 LAB — BASIC METABOLIC PANEL
CO2: 27 mEq/L (ref 19–32)
Glucose, Bld: 93 mg/dL (ref 70–99)
Potassium: 3.8 mEq/L (ref 3.5–5.1)
Sodium: 138 mEq/L (ref 135–145)

## 2011-03-03 LAB — RAPID URINE DRUG SCREEN, HOSP PERFORMED
Barbiturates: NOT DETECTED
Benzodiazepines: POSITIVE — AB
Cocaine: NOT DETECTED

## 2011-03-03 LAB — ETHANOL: Alcohol, Ethyl (B): <5

## 2011-03-03 MED ORDER — MECLIZINE HCL 25 MG PO TABS: 25.0000 mg | ORAL_TABLET | Freq: Four times a day (QID) | ORAL | Status: DC | PRN

## 2011-03-03 NOTE — ED Notes (Signed)
Per patient eating breakfast and started feeling dizzy to the point of feeling like he was going to faint. Patient states "I was able to make it to the bathroom and wash my face and I felt a little better then." Patient reports feeling "off balance." x2-3 days. Denies being diabetic.

## 2011-03-03 NOTE — ED Provider Notes (Signed)
History   Scribed for Felisa Bonier, MD, the patient was seen in room APA02/APA02. This chart was scribed by Clarita Crane. This patient's care was started at 1:45PM.   CSN: [id number] Arrival date & time: 03/03/2011 11:49 AM  Chief Complaint  Patient presents with  . Dizziness   HPI [name] is a 77 y.o. male who presents to the Emergency Department complaining of dizziness with associated diplopia onset this morning after eating breakfast which lasted 5-10 minutes before improving and gradually resolving. Patient describes dizziness as a combination of lightheadedness and room spinning. Patient also notes that his right ear has been congested for the past several days. Denies HA, tinnitus, chest pain, palpitations, nausea, vomiting, weakness, numbness and elicit drug use.  HPI ELEMENTS:  Onset: several hours ago Duration: 5-10 minutes before gradually resolving   Quality: combination of lightheadedness and room spinning   Context:  as above  Associated symptoms: +diplopia, right ear congestion. Denies  HA, tinnitus, chest pain, palpitations, nausea, vomiting, weakness, numbness.    PAST MEDICAL HISTORY:  Past Medical History  Diagnosis Date  . Knee pain   . Dizzy   . Panic attacks   . Acid reflux   . Alcoholism /alcohol abuse   . Anemia   . Barrett's esophagus   . Hyperlipidemia   . Arthritis     OA  . Hearing impairment   . MRSA (methicillin resistant staph aureus) culture positive     Sepsis of right knee  . H/O: UGI bleed   . Colon polyp   . Thrombocytopenia     Mild  . Depression     PAST SURGICAL HISTORY:  Past Surgical History  Procedure Date  . Appendectomy   . Tonsillectomy   . Bilateral cataract surgery   . Knee surgery   . Cardiac catheterization     FAMILY HISTORY:  History reviewed. No pertinent family history.   SOCIAL HISTORY: History   Social History  . Marital Status: Widowed    Spouse Name: N/A    Number of Children: N/A  .  Years of Education: N/A   Occupational History  . retired Air traffic controller     Social History Main Topics  . Smoking status: Former Games developer  . Smokeless tobacco: None  . Alcohol Use: Yes     occasional with binge drinking  . Drug Use: No  . Sexually Active: None   Other Topics Concern  . None   Social History Narrative  . None     Review of Systems 10 Systems reviewed and are negative for acute change except as noted in the HPI.  Allergies  Aspirin  Home Medications   Current Outpatient Rx  Name Route Sig Dispense Refill  . ASPIRIN 81 MG PO TBEC Oral Take 81 mg by mouth daily.      Marland Kitchen FOLIC ACID 1 MG PO TABS Oral Take 1 mg by mouth daily.      Marland Kitchen LORAZEPAM 1 MG PO TABS Oral Take 1 mg by mouth 3 (three) times daily.      Marland Kitchen ONE-DAILY MULTI VITAMINS PO TABS Oral Take 1 tablet by mouth daily.      Marland Kitchen OMEPRAZOLE 20 MG PO CPDR Oral Take 20 mg by mouth daily.        BP 138/70  Pulse 84  Temp(Src) 98.6 F (37 C) (Oral)  Resp 18  Ht 5' 10.5" (1.791 m)  Wt 183 lb (83.008 kg)  BMI 25.89 kg/m2  SpO2 99%  Physical Exam  Nursing note and vitals reviewed. Constitutional: He is oriented to person, place, and time. He appears well-developed and well-nourished. No distress.  HENT:  Head: Normocephalic and atraumatic.  Left Ear: Tympanic membrane normal.       Right tympanic membrane with some congestion behind it but no signs of infection noted.  Eyes: EOM are normal. Pupils are equal, round, and reactive to light.  Neck: Neck supple. No tracheal deviation present.  Cardiovascular: Normal rate, regular rhythm, S1 normal, S2 normal and normal heart sounds.   No murmur heard. Pulmonary/Chest: Effort normal and breath sounds normal. No respiratory distress. He has no wheezes. He has no rales.  Abdominal: He exhibits no distension.  Musculoskeletal: Normal range of motion.  Neurological: He is alert and oriented to person, place, and time. He has normal reflexes. No  cranial nerve deficit or sensory deficit. Coordination normal.  Skin: Skin is warm and dry.  Psychiatric: He has a normal mood and affect. His behavior is normal.    ED Course  Procedures MDM   OTHER DATA REVIEWED: Nursing notes, vital signs, and past medical records reviewed. Lab results reviewed and considered Imaging results reviewed and considered  DIAGNOSTIC STUDIES: Oxygen Saturation is 99% on room air, normal by my interpretation.     Date: 03/03/2011  Rate: 79 BPM  Rhythm: normal sinus rhythm  QRS Axis: normal  Intervals: PR prolonged  ST/T Wave abnormalities: normal  Conduction Disutrbances:first-degree A-V block   Narrative Interpretation:   Old EKG Reviewed: unchanged   LABS / RADIOLOGY: Results for orders placed during the hospital encounter of 03/03/11  GLUCOSE, CAPILLARY      Component Value Range   Glucose-Capillary 84  70 - 99 (mg/dL)  CBC      Component Value Range   WBC 6.0  4.0 - 10.5 (K/uL)   RBC 4.46  4.22 - 5.81 (MIL/uL)   Hemoglobin 12.8 (*) 13.0 - 17.0 (g/dL)   HCT 40.9  81.1 - 91.4 (%)   MCV 87.7  78.0 - 100.0 (fL)   MCH 28.7  26.0 - 34.0 (pg)   MCHC 32.7  30.0 - 36.0 (g/dL)   RDW 78.2  95.6 - 21.3 (%)   Platelets 191  150 - 400 (K/uL)  DIFFERENTIAL      Component Value Range   Neutrophils Relative 62  43 - 77 (%)   Neutro Abs 3.7  1.7 - 7.7 (K/uL)   Lymphocytes Relative 24  12 - 46 (%)   Lymphs Abs 1.4  0.7 - 4.0 (K/uL)   Monocytes Relative 10  3 - 12 (%)   Monocytes Absolute 0.6  0.1 - 1.0 (K/uL)   Eosinophils Relative 3  0 - 5 (%)   Eosinophils Absolute 0.2  0.0 - 0.7 (K/uL)   Basophils Relative 1  0 - 1 (%)   Basophils Absolute 0.0  0.0 - 0.1 (K/uL)  BASIC METABOLIC PANEL      Component Value Range   Sodium 138  135 - 145 (mEq/L)   Potassium 3.8  3.5 - 5.1 (mEq/L)   Chloride 102  96 - 112 (mEq/L)   CO2 27  19 - 32 (mEq/L)   Glucose, Bld 93  70 - 99 (mg/dL)   BUN 14  6 - 23 (mg/dL)   Creatinine, Ser 0.86  0.50 - 1.35 (mg/dL)     Calcium 9.1  8.4 - 10.5 (mg/dL)   GFR calc non Af Amer 81 (*) >90 (mL/min)  GFR calc Af Amer >90  >90 (mL/min)  POCT I-STAT TROPONIN I      Component Value Range   Troponin i, poc 0.00  0.00 - 0.08 (ng/mL)   Comment 3            No results found.  ED COURSE / COORDINATION OF CARE: Orders Placed This Encounter  Procedures  . Glucose, capillary  . CBC  . Differential  . Basic metabolic panel  . Maintain IV access  . Cardiac monitoring  . Orthostatic vital signs  . POCT i-Stat troponin I  . ED EKG     MDM: Differential Diagnosis: Benign paroxysmal positional vertigo, arrhythmia, anemia, electrolyte imbalance, orthostatic hypotension. Stroke not thought to be etiology based on history and physical exam.    PLAN: Discharge Home The patient is to return the emergency department if there is any worsening of symptoms. I have reviewed the discharge instructions with the patient/family  CONDITION ON DISCHARGE: Good  DIAGNOSIS: No diagnosis found.   MEDICATIONS GIVEN IN THE E.D. Medications - No data to display  I personally performed the services described in this documentation, which was scribed in my presence. The recorded information has been reviewed and considered.         Felisa Bonier, MD 03/03/11 [phone]

## 2011-03-03 NOTE — ED Notes (Signed)
Felisa Bonier, MD at the bedside for re-evaluation of the patient.

## 2011-03-10 ENCOUNTER — Telehealth: Payer: Self-pay | Admitting: Family Medicine

## 2011-03-10 LAB — BASIC METABOLIC PANEL
Calcium: 7.6 — ABNORMAL LOW
GFR calc Af Amer: >60
GFR calc non Af Amer: >60
Potassium: 3.2 — ABNORMAL LOW
Sodium: 139

## 2011-03-10 LAB — DIFFERENTIAL
Basophils Absolute: 0
Eosinophils Relative: 3
Lymphocytes Relative: 27
Lymphs Abs: 1.5
Monocytes Absolute: 0.6
Monocytes Relative: 10
Neutro Abs: 3.4

## 2011-03-10 LAB — CBC
HCT: 32.1 — ABNORMAL LOW
Hemoglobin: 10.7 — ABNORMAL LOW
RBC: 3.75 — ABNORMAL LOW
WBC: 5.7

## 2011-03-10 MED ORDER — MECLIZINE HCL 25 MG PO TABS: 25.0000 mg | ORAL_TABLET | Freq: Four times a day (QID) | ORAL | Status: AC | PRN

## 2011-03-10 NOTE — Telephone Encounter (Signed)
Refill sent, reviewed ED note

## 2011-03-11 LAB — DIFFERENTIAL
Basophils Absolute: 0
Basophils Relative: 0
Basophils Relative: 0
Basophils Relative: 0
Eosinophils Absolute: 0 — ABNORMAL LOW
Eosinophils Absolute: 0.1 — ABNORMAL LOW
Eosinophils Relative: 2
Lymphocytes Relative: 18
Lymphs Abs: 1.3
Lymphs Abs: 1.6
Monocytes Absolute: 0.4
Monocytes Relative: 7
Neutro Abs: 17 — ABNORMAL HIGH
Neutro Abs: 4.5
Neutrophils Relative %: 70
Neutrophils Relative %: 71
Neutrophils Relative %: 89 — ABNORMAL HIGH

## 2011-03-11 LAB — CBC
HCT: 43.3
Hemoglobin: 10.7 — ABNORMAL LOW
Hemoglobin: 14.4
MCHC: 33.2
MCV: 85.6
MCV: 85.6
Platelets: 145 — ABNORMAL LOW
RBC: 3.66 — ABNORMAL LOW
RBC: 5.06
RDW: 15
RDW: 15.3
WBC: 6.3
WBC: 7.2

## 2011-03-11 LAB — LIPASE, BLOOD: Lipase: 27

## 2011-03-11 LAB — URINE CULTURE
Colony Count: NO GROWTH
Culture: NO GROWTH

## 2011-03-11 LAB — POCT CARDIAC MARKERS: Troponin i, poc: <0.05

## 2011-03-11 LAB — URINE MICROSCOPIC-ADD ON

## 2011-03-11 LAB — COMPREHENSIVE METABOLIC PANEL
ALT: 20
Alkaline Phosphatase: 98
BUN: 22
CO2: 33 — ABNORMAL HIGH
Calcium: 8.6
GFR calc non Af Amer: 57 — ABNORMAL LOW
Glucose, Bld: 137 — ABNORMAL HIGH
Sodium: 137

## 2011-03-11 LAB — BASIC METABOLIC PANEL
BUN: 13
CO2: 26
Calcium: 7.1 — ABNORMAL LOW
Chloride: 104
Creatinine, Ser: 0.81
Creatinine, Ser: 0.88
GFR calc Af Amer: >60
GFR calc non Af Amer: >60
Glucose, Bld: 97
Sodium: 137

## 2011-03-11 LAB — BILIRUBIN, FRACTIONATED(TOT/DIR/INDIR): Total Bilirubin: 2.2 — ABNORMAL HIGH

## 2011-03-11 LAB — URINALYSIS, ROUTINE W REFLEX MICROSCOPIC
Leukocytes, UA: NEGATIVE
Nitrite: NEGATIVE
Specific Gravity, Urine: 1.02
pH: 8.5 — ABNORMAL HIGH

## 2011-03-11 LAB — MAGNESIUM: Magnesium: 2.4

## 2011-03-12 ENCOUNTER — Encounter: Payer: Self-pay | Admitting: Family Medicine

## 2011-03-12 ENCOUNTER — Ambulatory Visit (INDEPENDENT_AMBULATORY_CARE_PROVIDER_SITE_OTHER): Payer: Medicare HMO | Admitting: Family Medicine

## 2011-03-12 VITALS — BP 142/74 | HR 101 | Resp 16 | Ht 70.0 in | Wt 184.1 lb

## 2011-03-12 DIAGNOSIS — Z23 Encounter for immunization: Secondary | ICD-10-CM

## 2011-03-12 DIAGNOSIS — F419 Anxiety disorder, unspecified: Secondary | ICD-10-CM

## 2011-03-12 DIAGNOSIS — F411 Generalized anxiety disorder: Secondary | ICD-10-CM

## 2011-03-12 DIAGNOSIS — R42 Dizziness and giddiness: Secondary | ICD-10-CM

## 2011-03-12 MED ORDER — LORAZEPAM 1 MG PO TABS: 1.0000 mg | ORAL_TABLET | Freq: Three times a day (TID) | ORAL | Status: DC

## 2011-03-12 NOTE — Progress Notes (Signed)
  Subjective:    Patient ID: [name], male    DOB: September 07, 1927, 77 y.o.   MRN: [id number]  HPI Pt seen in ED on 03/03/11 for dizziness and lightheadedness. Work up was negative, including blood work and EKG, no CT head done. Symptoms resolved with dose of Ativan and Meclizine per pt. He has been out of his Ativan for past 4 days. He did not have any further episodes of dizziness until he ran out of his medications. Pt received 45 tablets of Ativan on 9/22 per pharmacy record He deines any etoh since June of this year  Review of Systems GEN- denies fatigue, fever, weight loss,weakness, recent illness HEENT- denies  change in vision, change in hearing CVS- denies chest pain, palpitations RESP- denies SOB, cough, wheeze MSK- + joint pain, muscle aches, injury Neuro- denies headache, +dizziness, denies syncope, seizure activity      Objective:   Physical Exam GEN- NAD, alert and oriented x3 HEENT- PERRL, EOMI, non injected sclera, pink conjunctiva, oropharynx clear, TM- soft wax obscurring TM bilaterally Neck- Supple, normal ROM CVS- RRR, no murmur RESP-CTAB EXT- No edema Pulses- Radial, DP- 2+ Neuro-CNII-XII in tact , no dizziness with movement of neck or standing from seating position or ambulating, no focal deficits Psych- not overly depressed or anxious appearing, no apparent SI, no hallucinations       Assessment & Plan:

## 2011-03-12 NOTE — Patient Instructions (Signed)
For your dizziness, if this persist after you have restarted your Ativan please call and we will give you instructions Use the meclizine as needed Follow-up in 3 months Today you received your flu and pneumonia shot You have been given refills on your Ativan

## 2011-03-13 DIAGNOSIS — R42 Dizziness and giddiness: Secondary | ICD-10-CM | POA: Insufficient documentation

## 2011-03-13 LAB — CBC
Hemoglobin: 14.2
MCHC: 33.6
MCV: 85
RBC: 4.96
WBC: 8.4

## 2011-03-13 LAB — DIFFERENTIAL
Basophils Absolute: 0
Eosinophils Absolute: 0
Eosinophils Relative: 0
Neutrophils Relative %: 67

## 2011-03-13 LAB — COMPREHENSIVE METABOLIC PANEL
ALT: 22
AST: 29
CO2: 18 — ABNORMAL LOW
Calcium: 8.4
Chloride: 97
Creatinine, Ser: 1.02
GFR calc Af Amer: >60
GFR calc non Af Amer: >60
Glucose, Bld: 176 — ABNORMAL HIGH
Sodium: 134 — ABNORMAL LOW
Total Bilirubin: 2 — ABNORMAL HIGH

## 2011-03-13 LAB — BASIC METABOLIC PANEL
Calcium: 8.1 — ABNORMAL LOW
Creatinine, Ser: 0.88
GFR calc Af Amer: >60
GFR calc non Af Amer: >60
Sodium: 138

## 2011-03-13 NOTE — Assessment & Plan Note (Signed)
Reviewed work-up, pt now only has symptoms in setting of being of ativan, which could or could not be coincidence. He denies any recent alcohol intake. Normal exam, normal work-up. Note CT head/MRI not done, but no stroke symptoms today. If this persist despite re-starting his benzo would image his head and refer to neuro

## 2011-03-13 NOTE — Assessment & Plan Note (Signed)
Long standing history, he been maintained on benzo per his previous PCP for many years- reviewed records, he has also had long standing problem with ETOH. Medication refilled. Pt appears competent today

## 2011-03-17 LAB — DIFFERENTIAL
Basophils Absolute: 0
Eosinophils Absolute: 0
Eosinophils Relative: 0
Lymphocytes Relative: 21
Monocytes Absolute: 0.5

## 2011-03-17 LAB — RAPID URINE DRUG SCREEN, HOSP PERFORMED
Barbiturates: NOT DETECTED
Cocaine: NOT DETECTED
Opiates: NOT DETECTED

## 2011-03-17 LAB — COMPREHENSIVE METABOLIC PANEL
Albumin: 3 — ABNORMAL LOW
BUN: 12
CO2: 29
Calcium: 8.1 — ABNORMAL LOW
Chloride: 103
Creatinine, Ser: 0.86
GFR calc non Af Amer: >60
Total Bilirubin: 1.5 — ABNORMAL HIGH

## 2011-03-17 LAB — CBC
HCT: 36.9 — ABNORMAL LOW
Hemoglobin: 12.5 — ABNORMAL LOW
MCHC: 33.7
MCV: 86.3
RDW: 16 — ABNORMAL HIGH

## 2011-03-17 LAB — BASIC METABOLIC PANEL
CO2: 30
Chloride: 102
GFR calc non Af Amer: >60
Glucose, Bld: 119 — ABNORMAL HIGH
Potassium: 3.4 — ABNORMAL LOW
Sodium: 142

## 2011-04-15 ENCOUNTER — Encounter: Payer: Self-pay | Admitting: Family Medicine

## 2011-04-16 ENCOUNTER — Ambulatory Visit (INDEPENDENT_AMBULATORY_CARE_PROVIDER_SITE_OTHER): Payer: Medicare HMO | Admitting: Family Medicine

## 2011-04-16 ENCOUNTER — Encounter: Payer: Self-pay | Admitting: Family Medicine

## 2011-04-16 VITALS — BP 138/80 | HR 90 | Resp 16 | Ht 70.0 in | Wt 191.8 lb

## 2011-04-16 DIAGNOSIS — F329 Major depressive disorder, single episode, unspecified: Secondary | ICD-10-CM

## 2011-04-16 DIAGNOSIS — F32A Depression, unspecified: Secondary | ICD-10-CM | POA: Insufficient documentation

## 2011-04-16 DIAGNOSIS — R42 Dizziness and giddiness: Secondary | ICD-10-CM

## 2011-04-16 DIAGNOSIS — G4700 Insomnia, unspecified: Secondary | ICD-10-CM | POA: Insufficient documentation

## 2011-04-16 DIAGNOSIS — R0989 Other specified symptoms and signs involving the circulatory and respiratory systems: Secondary | ICD-10-CM | POA: Insufficient documentation

## 2011-04-16 DIAGNOSIS — F1010 Alcohol abuse, uncomplicated: Secondary | ICD-10-CM

## 2011-04-16 MED ORDER — MIRTAZAPINE 15 MG PO TBDP: 15.0000 mg | ORAL_TABLET | Freq: Every day | ORAL | Status: DC

## 2011-04-16 MED ORDER — MIRTAZAPINE 15 MG PO TABS: 15.0000 mg | ORAL_TABLET | Freq: Every day | ORAL | Status: DC

## 2011-04-16 NOTE — Progress Notes (Signed)
  Subjective:    Patient ID: [name], male    DOB: 01/25/1928, 77 y.o.   MRN: [id number]  HPI Pt comes in with a c/o light headedness and feeling "off balance" frequently, espescialy if he turns his head too quickly. He has been on ativan for years as a part of withdrawing from alcohol as well as for anxiety. States he wants to get off the drug now. Reports being lonely and depressed esp since his wife of over 40 years died several years back. He denies suicidal or homicidal ideation or social isolation, but states he puts on a smiley face while often feeling depressed. Reports very poor sleep, both falling and staying asleep, and though he named ambien specifically, on further questioning, he could not ascertain that it was helpful   Review of Systems See HPI Denies recent fever or chills. Denies sinus pressure, nasal congestion, ear pain or sore throat. Denies chest congestion, productive cough or wheezing. Denies chest pains, palpitations and leg swelling Denies abdominal pain, nausea, vomiting,diarrhea or constipation.   Denies dysuria, frequency, hesitancy or incontinence. Denies joint pain, swelling and limitation in mobility. Denies headaches, seizures, numbness, or tingling.  Denies skin break down or rash.        Objective:   Physical Exam Patient alert and oriented and in no cardiopulmonary distress.  HEENT: No facial asymmetry, EOMI, no sinus tenderness,  oropharynx pink and moist.  Neck supple no adenopathy.Bruit.Bilateral cerumen impaction  Chest: Clear to auscultation bilaterally.  CVS: S1, S2 no murmurs, no S3.  ABD: Soft non tender. Bowel sounds normal.  Ext: No edema  MS: Adequate ROM spine, shoulders, hips and knees.  Skin: Intact, no ulcerations or rash noted.  Psych: Good eye contact, normal affect. Memory intact not anxious or depressed appearing.  CNS: CN 2-12 intact, power, tone and sensation normal throughout.        Assessment &  Plan:

## 2011-04-16 NOTE — Patient Instructions (Addendum)
F/u in 3 months with Dr Jeanice Lim.  [name] will start a new medication Remeron for depression and sleep at bedtime  Please make sure that you practice good sleep hygiene, we will give you a paper on this.   You are referred for an [place] of your neck arteries to ensure no blockage causing lightheadedness.  You are referred to ENT you have a lot of wax in both ears, do nOT use q tips, and the Doc will also check you for dizziness/imbalance  I hope you feel better soon.  Reduce ativan to one twice daily for the next 10 days , then 1 daily for 10 days , then stop

## 2011-04-17 NOTE — Assessment & Plan Note (Signed)
Reports no alcohol since past 6 months, not interested in [employer], finds them depressing

## 2011-04-17 NOTE — Assessment & Plan Note (Signed)
Recurrent light headedness with bruit will refer for doppler

## 2011-04-17 NOTE — Assessment & Plan Note (Signed)
Sleep hygiene discussed, reports good hygiene, start remeron, taper off ativan

## 2011-04-17 NOTE — Assessment & Plan Note (Signed)
ENt eval for chronic symptom, bilateral cerumen impaction noted

## 2011-04-17 NOTE — Assessment & Plan Note (Signed)
Not suicidal or homicidal, however pt freely admits to feeilng low and depressed most of the time, will start remeron, which will also help with sleep

## 2011-04-21 ENCOUNTER — Ambulatory Visit (HOSPITAL_COMMUNITY)
Admission: RE | Admit: 2011-04-21 | Discharge: 2011-04-21 | Disposition: A | Discharge status: Home / Self Care | Payer: Medicare HMO | Source: Ambulatory Visit | Attending: Family Medicine | Admitting: Family Medicine

## 2011-04-21 DIAGNOSIS — R42 Dizziness and giddiness: Secondary | ICD-10-CM | POA: Insufficient documentation

## 2011-04-21 DIAGNOSIS — R0989 Other specified symptoms and signs involving the circulatory and respiratory systems: Secondary | ICD-10-CM | POA: Insufficient documentation

## 2011-04-22 NOTE — Progress Notes (Signed)
Patient aware.

## 2011-05-08 ENCOUNTER — Ambulatory Visit (INDEPENDENT_AMBULATORY_CARE_PROVIDER_SITE_OTHER): Payer: Medicare HMO | Admitting: Otolaryngology

## 2011-05-08 DIAGNOSIS — R42 Dizziness and giddiness: Secondary | ICD-10-CM

## 2011-05-08 DIAGNOSIS — H814 Vertigo of central origin: Secondary | ICD-10-CM

## 2011-05-08 DIAGNOSIS — H903 Sensorineural hearing loss, bilateral: Secondary | ICD-10-CM

## 2011-05-08 DIAGNOSIS — H6120 Impacted cerumen, unspecified ear: Secondary | ICD-10-CM

## 2011-06-11 ENCOUNTER — Encounter: Payer: Self-pay | Admitting: Family Medicine

## 2011-06-11 ENCOUNTER — Ambulatory Visit (INDEPENDENT_AMBULATORY_CARE_PROVIDER_SITE_OTHER): Payer: Medicare Other | Admitting: Family Medicine

## 2011-06-11 VITALS — BP 140/74 | HR 91 | Resp 16 | Ht 70.0 in | Wt 195.8 lb

## 2011-06-11 DIAGNOSIS — F1010 Alcohol abuse, uncomplicated: Secondary | ICD-10-CM

## 2011-06-11 DIAGNOSIS — F3289 Other specified depressive episodes: Secondary | ICD-10-CM

## 2011-06-11 DIAGNOSIS — F419 Anxiety disorder, unspecified: Secondary | ICD-10-CM

## 2011-06-11 DIAGNOSIS — F32A Depression, unspecified: Secondary | ICD-10-CM

## 2011-06-11 DIAGNOSIS — E785 Hyperlipidemia, unspecified: Secondary | ICD-10-CM

## 2011-06-11 DIAGNOSIS — E559 Vitamin D deficiency, unspecified: Secondary | ICD-10-CM | POA: Insufficient documentation

## 2011-06-11 DIAGNOSIS — F411 Generalized anxiety disorder: Secondary | ICD-10-CM

## 2011-06-11 DIAGNOSIS — F329 Major depressive disorder, single episode, unspecified: Secondary | ICD-10-CM

## 2011-06-11 MED ORDER — ZOLPIDEM TARTRATE 5 MG PO TABS: 5.0000 mg | ORAL_TABLET | Freq: Every evening | ORAL | Status: DC | PRN

## 2011-06-11 MED ORDER — LORAZEPAM 1 MG PO TABS: 1.0000 mg | ORAL_TABLET | Freq: Three times a day (TID) | ORAL | Status: DC

## 2011-06-11 NOTE — Progress Notes (Signed)
  Subjective:    Patient ID: [name], male    DOB: 08/01/1927, 78 y.o.   MRN: [id number]  HPI    Anxiety - Patient here to followup on medication. He would like to restart his Ativan and is out of the prescription. He was seen a few weeks ago for dizziness and at that time he said he wanted to get off this medication. He was also feeling very depressed and not sleeping well. He was started on Remeron he said that this made him worse and actually made him more irritable and anxious therefore he stopped his medication after a couple weeks. He was trying to taper off his Ativan as directed however he felt worse and his anxiety worsened, he was only able to taper down to twice a day dosing before he began to increase to 3 times a day he's been out of his Ativan since yesterday     Depression-patient states he has been feeling somewhat down recently. He found out that his daughters not doing well in his near death day she lives in [place]. There currently trying to get her transferred closer to home. He continues to do his typical activities. He denies any crying episodes, suicidal ideation. No change in appetite  Dizziness - patient was seen Seen by ENT for ear impaction and evaluation for dizziness. Dilation was within normal limits. He also had a carotid Doppler done which did not show any significant stenosis. In dizziness has improved. He feels that the cerumen removal helped the most. ENT no reviewed.     Review of Systems - per above  GEN- denies fatigue, fever, weight loss,weakness, recent illness HEENT- denies  change in vision, change in hearing CVS- denies chest pain, palpitations RESP- denies SOB, cough, wheeze MSK- + joint pain, muscle aches, injury Neuro- denies headache, rare dizziness, denies syncope, seizure activityy       Objective:   Physical Exam GEN- NAD, alert and oriented x3 HEENT- PERRL, EOMI, non injected sclera, pink conjunctiva, MMM, oropharynx  clear,TM clear bilat, no impaction CVS- RRR, no murmur RESP-CTAB EXT- No edema Pulses- Radial, DP- 2+ Neuro- CNII-XII grossly in tact, no focal deficits        Assessment & Plan:

## 2011-06-11 NOTE — Patient Instructions (Signed)
F/U in 3 weeks Restart the Ativan three times a day Try the Ambien at bedtime- take it 1 hour before your bedtime Get your labs done, do not eat after midnight- we will discuss at the next visit

## 2011-06-12 NOTE — Assessment & Plan Note (Signed)
Check lipid profile

## 2011-06-12 NOTE — Assessment & Plan Note (Signed)
Patient denies recent alcohol use states his last use was in June. We'll restart him back on his long-term anxiolytic. I will review how very difficult time coming off this as he was on Librium for approximately 20 years and then switched to Ativan for the past few years.

## 2011-06-12 NOTE — Assessment & Plan Note (Signed)
Patient not do well with Remeron. Would consider trazodone in the past he sees Ambien which is help with sleep. He's not suicidal at this time he continues to participate in his daily activities. Will give trial of low-dose Ambien for sleep. If this is not improved then consider starting a low-dose SSRI

## 2011-06-12 NOTE — Assessment & Plan Note (Signed)
Restart anxolytic. Will reassess in 3 weeks.

## 2011-06-12 NOTE — Assessment & Plan Note (Signed)
Check vitamin D level 

## 2011-06-13 ENCOUNTER — Ambulatory Visit: Payer: Medicare HMO | Admitting: Family Medicine

## 2011-06-13 LAB — BASIC METABOLIC PANEL
BUN: 17 mg/dL (ref 6–23)
CO2: 24 mEq/L (ref 19–32)
Chloride: 106 mEq/L (ref 96–112)
Creat: 0.94 mg/dL (ref 0.50–1.35)
Glucose, Bld: 95 mg/dL (ref 70–99)

## 2011-06-13 LAB — CBC
HCT: 45.1 % (ref 39.0–52.0)
MCV: 90.7 fL (ref 78.0–100.0)
RBC: 4.97 MIL/uL (ref 4.22–5.81)
WBC: 5.8 10*3/uL (ref 4.0–10.5)

## 2011-06-13 LAB — LIPID PANEL
HDL: 40 mg/dL (ref >39)
Triglycerides: 127 mg/dL (ref <150)

## 2011-06-24 ENCOUNTER — Ambulatory Visit: Payer: Medicare HMO | Admitting: Family Medicine

## 2011-07-02 ENCOUNTER — Ambulatory Visit: Payer: Medicare Other | Admitting: Family Medicine

## 2011-07-03 ENCOUNTER — Encounter: Payer: Self-pay | Admitting: Family Medicine

## 2011-07-03 ENCOUNTER — Ambulatory Visit (INDEPENDENT_AMBULATORY_CARE_PROVIDER_SITE_OTHER): Payer: Medicare Other | Admitting: Family Medicine

## 2011-07-03 VITALS — BP 140/78 | HR 90 | Resp 18 | Ht 70.0 in | Wt 195.0 lb

## 2011-07-03 DIAGNOSIS — F32A Depression, unspecified: Secondary | ICD-10-CM

## 2011-07-03 DIAGNOSIS — F329 Major depressive disorder, single episode, unspecified: Secondary | ICD-10-CM

## 2011-07-03 DIAGNOSIS — F1010 Alcohol abuse, uncomplicated: Secondary | ICD-10-CM

## 2011-07-03 DIAGNOSIS — F419 Anxiety disorder, unspecified: Secondary | ICD-10-CM

## 2011-07-03 DIAGNOSIS — F411 Generalized anxiety disorder: Secondary | ICD-10-CM

## 2011-07-03 DIAGNOSIS — F3289 Other specified depressive episodes: Secondary | ICD-10-CM

## 2011-07-03 DIAGNOSIS — G4700 Insomnia, unspecified: Secondary | ICD-10-CM

## 2011-07-03 MED ORDER — FLUOXETINE HCL 20 MG PO TABS: 20.0000 mg | ORAL_TABLET | Freq: Every day | ORAL | Status: DC

## 2011-07-03 MED ORDER — OMEPRAZOLE 20 MG PO CPDR: 20.0000 mg | DELAYED_RELEASE_CAPSULE | Freq: Every day | ORAL | Status: DC

## 2011-07-03 NOTE — Patient Instructions (Signed)
Start the prozac for depression Continue the ativan as prescribed Your acid reflux medication was sent over F/u in 3 weeks for your mood

## 2011-07-04 NOTE — Assessment & Plan Note (Signed)
We'll start him on Prozac and continued Ativan at this time. Once he is on a stable antidepressant I will start to decrease his axiolytic. This will be very difficult as I stated in the past because of his long-term dependence. He may need to be referred to psychiatry

## 2011-07-04 NOTE — Assessment & Plan Note (Signed)
Plan to taper off Ativan in the future

## 2011-07-04 NOTE — Progress Notes (Signed)
  Subjective:    Patient ID: [name], male    DOB: 1927/12/11, 78 y.o.   MRN: [id number]  HPI   Patient presents because he wants to be tapered off his Ativan. He has been in the office before with this concern and then return asking to be put back on his Ativan. He has history of alcohol abuse and was on Librium for at least 20 years per report. He was then switched to Ativan however he is not sure how long he's been on it but it has been the least 5- 10 years. He states he feels very depressed and very shaky and jittery inside a day he does not show it on the outside. He is stressed over his family as well as finances. He does not feel that Ativan works very well to control his emotions. He denies any crying episodes. His sleep has not improved and Ambien did not help. He was also tried on Restoril which did not help. His appetite is good.   Review of Systems - per above  Psych- no SI    Objective:   Physical Exam GEN- NAD, alert and oriented x 3 Psych- anxious appearing, normal affect, not depressed appearing, normal speech, normal mentation Neuro- no tremor noted       Assessment & Plan:

## 2011-07-04 NOTE — Assessment & Plan Note (Signed)
No change with Ambien. At this time we'll hold off on initiating any other sleep aids and start antidepressant.

## 2011-07-04 NOTE — Assessment & Plan Note (Signed)
Patient denies any recent alcohol use however states his depression is so bad that he feels he may go back alcohol she does not feel better.

## 2011-07-24 ENCOUNTER — Encounter: Payer: Self-pay | Admitting: Family Medicine

## 2011-07-24 ENCOUNTER — Ambulatory Visit: Payer: Medicare Other | Admitting: Family Medicine

## 2011-08-01 ENCOUNTER — Encounter: Payer: Self-pay | Admitting: Family Medicine

## 2011-08-01 ENCOUNTER — Ambulatory Visit (INDEPENDENT_AMBULATORY_CARE_PROVIDER_SITE_OTHER): Payer: Medicare Other | Admitting: Family Medicine

## 2011-08-01 VITALS — BP 140/76 | HR 106 | Resp 18 | Ht 70.0 in | Wt 192.0 lb

## 2011-08-01 DIAGNOSIS — F32A Depression, unspecified: Secondary | ICD-10-CM

## 2011-08-01 DIAGNOSIS — F329 Major depressive disorder, single episode, unspecified: Secondary | ICD-10-CM

## 2011-08-01 DIAGNOSIS — E785 Hyperlipidemia, unspecified: Secondary | ICD-10-CM

## 2011-08-01 DIAGNOSIS — G4700 Insomnia, unspecified: Secondary | ICD-10-CM

## 2011-08-01 DIAGNOSIS — F419 Anxiety disorder, unspecified: Secondary | ICD-10-CM

## 2011-08-01 DIAGNOSIS — F411 Generalized anxiety disorder: Secondary | ICD-10-CM

## 2011-08-01 DIAGNOSIS — F3289 Other specified depressive episodes: Secondary | ICD-10-CM

## 2011-08-01 MED ORDER — FLUOXETINE HCL 20 MG PO TABS: 20.0000 mg | ORAL_TABLET | Freq: Every day | ORAL | Status: DC

## 2011-08-01 NOTE — Patient Instructions (Signed)
Watch the fried foods and fatty foods, limit your breads. Continue the prozac and Ativan as prescribed Your kidney's look good, your hemoglobin was normal F/U in 2 months

## 2011-08-03 NOTE — Assessment & Plan Note (Signed)
Improved, continue current meds, no taper in ativan at this time

## 2011-08-03 NOTE — Assessment & Plan Note (Signed)
I will not add another sleeping agent, today he thinks his sleep is better

## 2011-08-03 NOTE — Assessment & Plan Note (Signed)
Improved, continue  meds 

## 2011-08-03 NOTE — Progress Notes (Signed)
  Subjective:    Patient ID: [name], male    DOB: Jan 03, 1928, 78 y.o.   MRN: [id number]  HPI  Pt here to f/u anxiety and depression, and labs  Labs reviewed- overall looks good, LDL slightly elevated  Depression/anxiety- taking prozac, initially he took for 2 days then stopped because he thought it was keeping him awake, but has since restarted after speaking to his grandson who is a Engineer, civil (consulting). He feels a lot better, his sleep has improved. He still thinks he needs the ativan. No SI, no hallucinations, denies HA, dizziness. His daughter is out of the hospital and he plans to go to [place] to stay with her for a few weeks in the near future, thinking of her condition saddens him.   Review of Systems - per above     Objective:   Physical Exam EN- NAD, alert and oriented x 3 Psych- anxious appearing, normal affect, not depressed appearing, normal speech, normal mentation         Assessment & Plan:    15 minutes visit- discussing depression/axiety

## 2011-08-03 NOTE — Assessment & Plan Note (Addendum)
Discussed watching the fatty and fried foods, but diet otherwise liberated,otherwise labs look good. No meds needed

## 2011-09-01 ENCOUNTER — Telehealth: Payer: Self-pay | Admitting: Family Medicine

## 2011-09-01 MED ORDER — FLUOXETINE HCL 20 MG PO TABS: 20.0000 mg | ORAL_TABLET | Freq: Every day | ORAL | Status: DC

## 2011-09-01 NOTE — Telephone Encounter (Signed)
Refill sent in

## 2011-09-08 ENCOUNTER — Telehealth: Payer: Self-pay | Admitting: Family Medicine

## 2011-09-08 ENCOUNTER — Other Ambulatory Visit: Payer: Self-pay

## 2011-09-08 MED ORDER — LORAZEPAM 1 MG PO TABS: 1.0000 mg | ORAL_TABLET | Freq: Three times a day (TID) | ORAL | Status: DC

## 2011-09-08 NOTE — Telephone Encounter (Signed)
Med reordered.

## 2011-09-22 ENCOUNTER — Ambulatory Visit (INDEPENDENT_AMBULATORY_CARE_PROVIDER_SITE_OTHER): Payer: Medicare Other | Admitting: Family Medicine

## 2011-09-22 ENCOUNTER — Encounter: Payer: Self-pay | Admitting: Family Medicine

## 2011-09-22 VITALS — BP 158/80 | HR 80 | Resp 18 | Ht 70.0 in | Wt 188.0 lb

## 2011-09-22 DIAGNOSIS — R21 Rash and other nonspecific skin eruption: Secondary | ICD-10-CM

## 2011-09-22 DIAGNOSIS — I10 Essential (primary) hypertension: Secondary | ICD-10-CM | POA: Insufficient documentation

## 2011-09-22 DIAGNOSIS — IMO0001 Reserved for inherently not codable concepts without codable children: Secondary | ICD-10-CM

## 2011-09-22 DIAGNOSIS — R030 Elevated blood-pressure reading, without diagnosis of hypertension: Secondary | ICD-10-CM

## 2011-09-22 DIAGNOSIS — M25569 Pain in unspecified knee: Secondary | ICD-10-CM

## 2011-09-22 MED ORDER — PREDNISONE 10 MG PO TABS: ORAL_TABLET | ORAL | Status: DC

## 2011-09-22 MED ORDER — SULFAMETHOXAZOLE-TRIMETHOPRIM 800-160 MG PO TABS: 1.0000 | ORAL_TABLET | Freq: Two times a day (BID) | ORAL | Status: AC

## 2011-09-22 NOTE — Patient Instructions (Signed)
I am treating for a rash- take the prednisone and the antibiotics I will refer you Dr.Harrison for your knee You can take aleve or advil  Continue your other medications Keep previous appt in May

## 2011-09-22 NOTE — Progress Notes (Signed)
  Subjective:    Patient ID: [name], male    DOB: [birth year], 78 y.o.   MRN: [id number]  HPI  Right knee pain- s/p surgical intervention 1 year ago, increased knee pain past week, pain with walking, occasional swelling, no specific injury  Rash- past few weeks has had increased redness over a site, where he previously had a picc line 1 year ago, also has itchy bumps on back and chest. No new lotion, soap, no new meds, no insect bites. +pruritic   Review of Systems  GEN- denies fatigue, fever, weight loss,weakness, recent illness HEENT- denies eye drainage, change in vision, nasal discharge, CVS- denies chest pain, palpitations RESP- denies SOB, cough, wheeze ABD- denies N/V, change in stools, abd pain GU- denies dysuria, hematuria, dribbling, incontinence MSK- + joint pain, muscle aches, injury Neuro- denies headache, dizziness, syncope, seizure activity       Objective:   Physical Exam GEN- NAD, alert and oriented x3 HEENT- PERRL, EOMI, non injected sclera, pink conjunctiva, MMM, oropharynx clear, CVS- RRR, no murmur RESP-CTAB ABD-NABS,soft, NT,ND EXT- No edema Right Knee- normal inspection,no warmth, no effusion, TTP inferior joint line, mild crepitus, pain with flexion, Left knee- good ROM Pulses- Radial, DP- 2+ Skin- Raised nickle size erythematous lesion over picc line site, scaley appearance, no drainage, blanching rash, erythematous maculopapular rash over chest, arms and upper back       Assessment & Plan:

## 2011-09-22 NOTE — Assessment & Plan Note (Signed)
Refer to ortho, prn NSAIDS

## 2011-09-22 NOTE — Assessment & Plan Note (Signed)
He has noticed increased redness to the rash over his picc line site, will treat with antibiotics and low dose prednisone. TOpical cream also given.

## 2011-09-22 NOTE — Assessment & Plan Note (Signed)
Will follow BP at f/u visit, if still elevated will start meds

## 2011-10-02 ENCOUNTER — Ambulatory Visit (INDEPENDENT_AMBULATORY_CARE_PROVIDER_SITE_OTHER): Payer: Medicare Other | Admitting: Orthopedic Surgery

## 2011-10-02 ENCOUNTER — Encounter: Payer: Self-pay | Admitting: Orthopedic Surgery

## 2011-10-02 ENCOUNTER — Ambulatory Visit (INDEPENDENT_AMBULATORY_CARE_PROVIDER_SITE_OTHER): Payer: Medicare Other

## 2011-10-02 VITALS — BP 110/60 | Ht 70.0 in | Wt 188.0 lb

## 2011-10-02 DIAGNOSIS — M25569 Pain in unspecified knee: Secondary | ICD-10-CM

## 2011-10-02 DIAGNOSIS — M1710 Unilateral primary osteoarthritis, unspecified knee: Secondary | ICD-10-CM

## 2011-10-02 MED ORDER — TRAMADOL-ACETAMINOPHEN 37.5-325 MG PO TABS: 1.0000 | ORAL_TABLET | ORAL | Status: AC | PRN

## 2011-10-02 NOTE — Patient Instructions (Signed)
Wear and Tear Disorders of the Knee (Arthritis, Osteoarthritis)  Everyone will experience wear and tear injuries (arthritis, osteoarthritis) of the knee. These are the changes we all get as we age. They come from the joint stress of daily living. The amount of cartilage damage in your knee and your symptoms determine if you need surgery. Mild problems require approximately two months recovery time. More severe problems take several months to recover. With mild problems, your surgeon may find worn and rough cartilage surfaces. With severe changes, your surgeon may find cartilage that has completely worn away and exposed the bone. Loose bodies of bone and cartilage, bone spurs (excess bone growth), and injuries to the menisci (cushions between the large bones of your leg) are also common. All of these problems can cause pain.  For a mild wear and tear problem, rough cartilage may simply need to be shaved and smoothed. For more severe problems with areas of exposed bone, your surgeon may use an instrument for roughing up the bone surfaces to stimulate new cartilage growth. Loose bodies are usually removed. Torn menisci may be trimmed or repaired.  ABOUT THE ARTHROSCOPIC PROCEDURE  Arthroscopy is a surgical technique. It allows your orthopedic surgeon to diagnose and treat your knee injury with accuracy. The surgeon looks into your knee through a small scope. The scope is like a small (pencil-sized) telescope. Arthroscopy is less invasive than open knee surgery. You can expect a more rapid recovery. After the procedure, you will be moved to a recovery area until most of the effects of the medication have worn off. Your caregiver will discuss the test results with you.  RECOVERY   The severity of the arthritis and the type of procedure performed will determine recovery time. Other important factors include age, physical condition, medical conditions, and the type of rehabilitation program. Strengthening your muscles after arthroscopy helps guarantee a better recovery. Follow your caregiver's instructions. Use crutches, rest, elevate, ice, and do knee exercises as instructed. Your caregivers will help you and instruct you with exercises and other physical therapy required to regain your mobility, muscle strength, and functioning following surgery. Only take over-the-counter or prescription medicines for pain, discomfort, or fever as directed by your caregiver.    SEEK MEDICAL CARE IF:     There is increased bleeding (more than a small spot) from the wound.   You notice redness, swelling, or increasing pain in the wound.   Pus is coming from wound.   You develop an unexplained oral temperature above 102 F (38.9 C) , or as your caregiver suggests.   You notice a foul smell coming from the wound or dressing.   You have severe pain with motion of the knee.  SEEK IMMEDIATE MEDICAL CARE IF:     You develop a rash.   You have difficulty breathing.   You have any allergic problems.  MAKE SURE YOU:     Understand these instructions.   Will watch your condition.   Will get help right away if you are not doing well or get worse.  Document Released: 05/16/2000 Document Revised: 05/08/2011 Document Reviewed: 10/13/2007  [employer] Patient Information 2012 [employer]

## 2011-10-02 NOTE — Progress Notes (Signed)
Patient ID: [name], male   DOB: [birth year], 78 y.o.   MRN: [id number] Chief Complaint  Patient presents with  . Knee Pain    right knee pain    BP 110/60  Ht 5\' 10"  (1.778 m)  Wt 85.276 kg (188 lb)  BMI 26.98 kg/m2   Past Medical History  Diagnosis Date  . Knee pain   . Dizzy   . Panic attacks   . Acid reflux   . Alcoholism /alcohol abuse   . Anemia   . Barrett's esophagus   . Hyperlipidemia   . Arthritis     OA  . Hearing impairment   . MRSA (methicillin resistant staph aureus) culture positive     Sepsis of right knee  . H/O: UGI bleed   . Colon polyp   . Thrombocytopenia     Mild  . Depression     Past Surgical History  Procedure Date  . Appendectomy   . Tonsillectomy   . Bilateral cataract surgery   . Knee surgery 4.2011    arthroscopic lavage, RIGHT knee for septic arthritis  . Cardiac catheterization     The patient was referred to by Dr. Jeanice Lim.  He was treated for septic arthritis of the RIGHT knee with arthroscopic lavage and 6 weeks of IV antibiotics in 2011 recovered.   Increasing knee pain over the last month. He can put his to legs together he has to put a pillow between his legs at night There is no catching, locking, or giving way of the knee. The pain is primarily medial. He was told in 1984, that he needs bilateral knee replacements, but says his pain is no worse than it was back at that time.  His review of systems is otherwise negative for any major medical symptoms. At this time.  Vital signs are stable as recorded  General appearance is normal  The patient is alert and oriented x3  The patient's mood and affect are normal  Gait assessment: He is ambulatory without assistive devices. He does have varus deformities and flexion contractures of both knees The cardiovascular exam reveals normal pulses and temperature without edema swelling.  The lymphatic system is negative for palpable lymph nodes  The sensory exam is  normal.  There are no pathologic reflexes.  Balance is normal.   Exam of the The RIGHT knee has a range of motion of 10-115 with a flexion contracture. He is in varus. He is tender over the medial joint line. There is no swelling or redness to the knee. The knee is stable to muscle tone is excellent. Skin is normal and intact.  X-rays show varus osteoarthritis with all 3 compartments involved. There is symmetric joint space narrowing. However, in the medial and lateral joint lines, and it is severe very  Impression osteoarthritis, RIGHT knee.  Based on his recent episode of septic arthritis. He would be in his best interest not to try to have surgery with knee replacement.  Recommend pain medication as a treatment.

## 2011-10-03 ENCOUNTER — Ambulatory Visit (INDEPENDENT_AMBULATORY_CARE_PROVIDER_SITE_OTHER): Payer: Medicare Other | Admitting: Family Medicine

## 2011-10-03 VITALS — BP 150/80 | HR 100 | Resp 15 | Wt 186.0 lb

## 2011-10-03 DIAGNOSIS — I10 Essential (primary) hypertension: Secondary | ICD-10-CM

## 2011-10-03 DIAGNOSIS — R21 Rash and other nonspecific skin eruption: Secondary | ICD-10-CM

## 2011-10-03 DIAGNOSIS — M25569 Pain in unspecified knee: Secondary | ICD-10-CM

## 2011-10-05 ENCOUNTER — Encounter: Payer: Self-pay | Admitting: Family Medicine

## 2011-10-05 NOTE — Progress Notes (Signed)
  Subjective:    Patient ID: [name], male    DOB: 1927-10-18, 79 y.o.   MRN: [id number]  HPI  Pt here to follow-up previous visit, seen by ortho, told pain management only because of history of septic arthritis, started on ultram.  Rash on back and chest resolved with prednisone, rash over picc line still present, bacitracin did not help   Review of Systems   GEN- denies fatigue, fever, weight loss,weakness, recent illness HEENT- denies eye drainage, change in vision, nasal discharge, CVS- denies chest pain, palpitations RESP- denies SOB, cough, wheeze ABD- denies N/V, change in stools, abd pain GU- denies dysuria, hematuria, dribbling, incontinence MSK- + joint pain, muscle aches, injury Neuro- denies headache, dizziness, syncope, seizure activity     Objective:   Physical Exam GEN- NAD, alert and oriented x3 CVS- RRR, no murmur RESP-CTAB EXT- No edema Pulses- Radial, DP- 2+ Skin- Raised nickle size erythematous lesion over picc line site, scaley appearance, no drainage, blanching rash,       Assessment & Plan:

## 2011-10-05 NOTE — Assessment & Plan Note (Signed)
OA of knee, ultracet

## 2011-10-05 NOTE — Assessment & Plan Note (Signed)
Start low dose norvasc

## 2011-10-05 NOTE — Assessment & Plan Note (Signed)
Will treat the nickle size lesion with anti-fungal if not beet in 2 weeks will refer to dermatology.

## 2011-10-16 ENCOUNTER — Encounter: Payer: Self-pay | Admitting: Family Medicine

## 2011-10-16 ENCOUNTER — Ambulatory Visit (INDEPENDENT_AMBULATORY_CARE_PROVIDER_SITE_OTHER): Payer: Medicare Other | Admitting: Family Medicine

## 2011-10-16 VITALS — BP 120/74 | HR 101 | Resp 16 | Ht 70.0 in | Wt 188.0 lb

## 2011-10-16 DIAGNOSIS — D239 Other benign neoplasm of skin, unspecified: Secondary | ICD-10-CM

## 2011-10-16 DIAGNOSIS — R21 Rash and other nonspecific skin eruption: Secondary | ICD-10-CM

## 2011-10-16 DIAGNOSIS — I10 Essential (primary) hypertension: Secondary | ICD-10-CM

## 2011-10-16 DIAGNOSIS — D229 Melanocytic nevi, unspecified: Secondary | ICD-10-CM

## 2011-10-16 MED ORDER — METHYLPREDNISOLONE SODIUM SUCC 125 MG IJ SOLR
125.0000 mg | Freq: Once | INTRAMUSCULAR | Status: AC
  Administered 2011-10-16: 125 mg via INTRAMUSCULAR

## 2011-10-16 MED ORDER — PREDNISONE 10 MG PO TABS: ORAL_TABLET | ORAL | Status: DC

## 2011-10-16 NOTE — Assessment & Plan Note (Addendum)
minimal improvement in discrete lesion on arm, unclear cause of more generalized rash, he actually started prednisone 2 1/2 months before the rash initially started therefore will not d/c at this time Solumedrol given, repeat prednisone, benadryl to be added

## 2011-10-16 NOTE — Progress Notes (Signed)
  Subjective:    Patient ID: [name], male    DOB: 1928/01/06, 78 y.o.   MRN: [id number]  HPI  Pt here to f/u blood pressure medication and rash.He has been using anti-fungal medication which has cleared up center of rash on right inner arm but it is still present.He has also broken back out in rash on trunk, he was concerned it may be due to his prozac. Spots very itchy, this is the same rash that previously resolved with prednisone. No new exposures, no new soap, lotion, detergent.    Review of Systems    GEN- denies fatigue, fever, weight loss,weakness, recent illness HEENT- denies eye drainage, change in vision, nasal discharge, CVS- denies chest pain, palpitations RESP- denies SOB, cough, wheeze ABD- denies N/V, change in stools, abd pain GU- denies dysuria, hematuria, dribbling, incontinence MSK- + joint pain, muscle aches, injury Neuro- denies headache, dizziness, syncope, seizure activity      Objective:   Physical Exam GEN- NAD, alert and oriented x3 HEENT- PERRL, EOMI, non injected sclera, pink conjunctiva, MMM, oropharynx clear, CVS- RRR, no murmur RESP-CTAB EXT- No edema Pulses- Radial, DP- 2+ Skin- Raised nickle size erythematous lesion with central clearing, scaley appearance, no drainage, blanching rash, erythematous maculopapular rash over chest, arms and upper back,neck and right side of face extending to forehead.  Multiple seborrheic keratosis and moles       Assessment & Plan:

## 2011-10-16 NOTE — Patient Instructions (Signed)
I will refer you to Dr. Ceasar Mons have been given a shot of prednisone , start the prednisone pills tomorrow Also take benadryl as directed on the box Your blood pressure looks good continue current medications F/U 3 months

## 2011-10-16 NOTE — Assessment & Plan Note (Signed)
Much improved, continue norvasc

## 2011-10-21 ENCOUNTER — Ambulatory Visit (INDEPENDENT_AMBULATORY_CARE_PROVIDER_SITE_OTHER): Payer: Medicare Other | Admitting: Family Medicine

## 2011-10-21 ENCOUNTER — Encounter: Payer: Self-pay | Admitting: Family Medicine

## 2011-10-21 VITALS — BP 144/90 | HR 84 | Resp 15 | Ht 70.0 in | Wt 186.0 lb

## 2011-10-21 DIAGNOSIS — L0292 Furuncle, unspecified: Secondary | ICD-10-CM | POA: Insufficient documentation

## 2011-10-21 DIAGNOSIS — R21 Rash and other nonspecific skin eruption: Secondary | ICD-10-CM

## 2011-10-21 DIAGNOSIS — H61899 Other specified disorders of external ear, unspecified ear: Secondary | ICD-10-CM

## 2011-10-21 DIAGNOSIS — L700 Acne vulgaris: Secondary | ICD-10-CM

## 2011-10-21 DIAGNOSIS — L989 Disorder of the skin and subcutaneous tissue, unspecified: Secondary | ICD-10-CM

## 2011-10-21 MED ORDER — SULFAMETHOXAZOLE-TRIMETHOPRIM 800-160 MG PO TABS: 1.0000 | ORAL_TABLET | Freq: Two times a day (BID) | ORAL | Status: DC

## 2011-10-21 NOTE — Assessment & Plan Note (Signed)
Small boil noted in bearded region, pt shaves, treat with bactrim warm compresses

## 2011-10-21 NOTE — Assessment & Plan Note (Signed)
Appears to be whitehead in ear, small amount of drainage with palpation with qtip and lesion then flattened out, no tick seen

## 2011-10-21 NOTE — Patient Instructions (Signed)
For the boil beneath your chin- take the antibiotics, use warm compresses three times a day, it may drain , if it gets bigger call For your ear- clean with warm wash rag, you can dab hydrogen peroxide on the area F/U with dermatology

## 2011-10-21 NOTE — Assessment & Plan Note (Signed)
Rash still seen on chest, but itching resolved

## 2011-10-21 NOTE — Progress Notes (Signed)
  Subjective:    Patient ID: [name], male    DOB: [birth year], 78 y.o.   MRN: [id number]  HPI Right eat sore for past day, thinks he may have a tick in it. Rash is much better, he has not had any itching, has f/u with derm on Friday. He has pimple like bump on his chin that came up, no recent drainage.    Review of Systems  GEN- denies fatigue, fever, weight loss,weakness, recent illness CVS- No CP RESP-no SOB GI- denies N/V MSK- denies joint pain, muscle aches, injury        Objective:   Physical Exam GEN-NAD, alert and oriented x 3 HEENT- Oropharynx clear, Right ear small white head noted on inner ear- mild drainage, no pus in canal TM clear,Left TM cclear, canal clear Skin- Raised nickle size erythematous lesion with central clearing- unchanged ,mild erythematous maculopapular rash over chest, , small boil noted beneath left side of chin, mild drainage, mild TTP, no erythema surrounding Multiple seborrheic keratosis and moles       Assessment & Plan:

## 2011-11-03 ENCOUNTER — Other Ambulatory Visit: Payer: Self-pay | Admitting: Family Medicine

## 2011-11-05 ENCOUNTER — Other Ambulatory Visit: Payer: Self-pay

## 2011-11-05 MED ORDER — OMEPRAZOLE 20 MG PO CPDR: 20.0000 mg | DELAYED_RELEASE_CAPSULE | Freq: Every day | ORAL | Status: DC

## 2011-12-08 ENCOUNTER — Telehealth: Payer: Self-pay | Admitting: Family Medicine

## 2011-12-08 ENCOUNTER — Ambulatory Visit: Payer: Medicare Other | Admitting: Family Medicine

## 2011-12-08 ENCOUNTER — Other Ambulatory Visit: Payer: Self-pay

## 2011-12-08 MED ORDER — AMLODIPINE BESYLATE 5 MG PO TABS: 5.0000 mg | ORAL_TABLET | Freq: Every day | ORAL | Status: DC

## 2011-12-08 MED ORDER — FLUOXETINE HCL 20 MG PO TABS: 20.0000 mg | ORAL_TABLET | Freq: Every day | ORAL | Status: DC

## 2011-12-08 MED ORDER — OMEPRAZOLE 20 MG PO CPDR: 20.0000 mg | DELAYED_RELEASE_CAPSULE | Freq: Every day | ORAL | Status: DC

## 2011-12-08 MED ORDER — LORAZEPAM 1 MG PO TABS: 1.0000 mg | ORAL_TABLET | Freq: Three times a day (TID) | ORAL | Status: DC

## 2011-12-08 NOTE — Telephone Encounter (Signed)
Patient was given rxs this am

## 2012-01-16 ENCOUNTER — Encounter: Payer: Self-pay | Admitting: Family Medicine

## 2012-01-16 ENCOUNTER — Ambulatory Visit (INDEPENDENT_AMBULATORY_CARE_PROVIDER_SITE_OTHER): Payer: Medicare Other | Admitting: Family Medicine

## 2012-01-16 VITALS — BP 150/82 | HR 86 | Resp 18 | Ht 70.0 in | Wt 182.1 lb

## 2012-01-16 DIAGNOSIS — IMO0002 Reserved for concepts with insufficient information to code with codable children: Secondary | ICD-10-CM

## 2012-01-16 DIAGNOSIS — F3289 Other specified depressive episodes: Secondary | ICD-10-CM

## 2012-01-16 DIAGNOSIS — I10 Essential (primary) hypertension: Secondary | ICD-10-CM

## 2012-01-16 DIAGNOSIS — E785 Hyperlipidemia, unspecified: Secondary | ICD-10-CM

## 2012-01-16 DIAGNOSIS — F329 Major depressive disorder, single episode, unspecified: Secondary | ICD-10-CM

## 2012-01-16 DIAGNOSIS — F411 Generalized anxiety disorder: Secondary | ICD-10-CM

## 2012-01-16 DIAGNOSIS — M1710 Unilateral primary osteoarthritis, unspecified knee: Secondary | ICD-10-CM

## 2012-01-16 NOTE — Progress Notes (Signed)
  Subjective:    Patient ID: [name], male    DOB: 1927-08-08, 78 y.o.   MRN: [id number]  HPI Patient here to followup chronic medical problems. He has no concerns today. He was seen by orthopedics for knee pain and get an injection into the knee. Hypertension-he has not taken his blood pressure medications this morning. He's had no concerns with the medication. He needs a referral to the eye doctor.   Review of Systems   GEN- denies fatigue, fever, weight loss,weakness, recent illness HEENT- denies eye drainage, change in vision, nasal discharge, CVS- denies chest pain, palpitations RESP- denies SOB, cough, wheeze ABD- denies N/V, change in stools, abd pain GU- denies dysuria, hematuria, dribbling, incontinence MSK- + joint pain, muscle aches, injury Neuro- denies headache, dizziness, syncope, seizure activity      Objective:   Physical Exam GEN- NAD, alert and oriented x3 HEENT- PERRL, EOMI, non injected sclera, pink conjunctiva, MMM, oropharynx clear Neck- Supple, no thryomegaly CVS- RRR, no murmur RESP-CTAB ABD-NABS,soft,NT.ND EXT- No edema Pulses- Radial, DP- 2+ Psych-normal affect and Mood        Assessment & Plan:

## 2012-01-16 NOTE — Patient Instructions (Addendum)
Get the labs done before the next visit Continue current medications F/U 3 months

## 2012-01-19 NOTE — Assessment & Plan Note (Signed)
FLP reviewed again, looks good overall

## 2012-01-19 NOTE — Assessment & Plan Note (Signed)
PHQ -9 score 1, stable on xanax

## 2012-01-19 NOTE — Assessment & Plan Note (Signed)
Evaluated by ortho, reviewed note

## 2012-01-19 NOTE — Assessment & Plan Note (Signed)
Mood stable on celexa, PHQ-9 Score is 1

## 2012-01-19 NOTE — Assessment & Plan Note (Signed)
suboptimal off meds today, previous wnl, no change to meds

## 2012-03-03 ENCOUNTER — Other Ambulatory Visit: Payer: Self-pay | Admitting: Family Medicine

## 2012-04-12 LAB — CBC
HCT: 41.2 % (ref 39.0–52.0)
Hemoglobin: 14.2 g/dL (ref 13.0–17.0)
MCHC: 34.5 g/dL (ref 30.0–36.0)
MCV: 89.4 fL (ref 78.0–100.0)
RDW: 13.3 % (ref 11.5–15.5)

## 2012-04-12 LAB — BASIC METABOLIC PANEL
CO2: 26 mEq/L (ref 19–32)
Chloride: 107 mEq/L (ref 96–112)
Glucose, Bld: 100 mg/dL — ABNORMAL HIGH (ref 70–99)
Potassium: 4.5 mEq/L (ref 3.5–5.3)
Sodium: 140 mEq/L (ref 135–145)

## 2012-04-16 ENCOUNTER — Ambulatory Visit (INDEPENDENT_AMBULATORY_CARE_PROVIDER_SITE_OTHER): Payer: Medicare Other | Admitting: Family Medicine

## 2012-04-16 ENCOUNTER — Encounter: Payer: Self-pay | Admitting: Family Medicine

## 2012-04-16 VITALS — BP 130/80 | HR 91 | Resp 16 | Ht 70.0 in | Wt 186.0 lb

## 2012-04-16 DIAGNOSIS — I10 Essential (primary) hypertension: Secondary | ICD-10-CM

## 2012-04-16 DIAGNOSIS — M179 Osteoarthritis of knee, unspecified: Secondary | ICD-10-CM

## 2012-04-16 DIAGNOSIS — R251 Tremor, unspecified: Secondary | ICD-10-CM | POA: Insufficient documentation

## 2012-04-16 DIAGNOSIS — F3289 Other specified depressive episodes: Secondary | ICD-10-CM

## 2012-04-16 DIAGNOSIS — F411 Generalized anxiety disorder: Secondary | ICD-10-CM

## 2012-04-16 DIAGNOSIS — IMO0002 Reserved for concepts with insufficient information to code with codable children: Secondary | ICD-10-CM

## 2012-04-16 DIAGNOSIS — F419 Anxiety disorder, unspecified: Secondary | ICD-10-CM

## 2012-04-16 DIAGNOSIS — E785 Hyperlipidemia, unspecified: Secondary | ICD-10-CM

## 2012-04-16 DIAGNOSIS — F329 Major depressive disorder, single episode, unspecified: Secondary | ICD-10-CM

## 2012-04-16 DIAGNOSIS — R259 Unspecified abnormal involuntary movements: Secondary | ICD-10-CM

## 2012-04-16 DIAGNOSIS — G4700 Insomnia, unspecified: Secondary | ICD-10-CM

## 2012-04-16 DIAGNOSIS — M1710 Unilateral primary osteoarthritis, unspecified knee: Secondary | ICD-10-CM

## 2012-04-16 DIAGNOSIS — F32A Depression, unspecified: Secondary | ICD-10-CM

## 2012-04-16 MED ORDER — TRAZODONE HCL 50 MG PO TABS: 50.0000 mg | ORAL_TABLET | Freq: Every day | ORAL | Status: DC

## 2012-04-16 MED ORDER — AMLODIPINE BESYLATE 5 MG PO TABS: 5.0000 mg | ORAL_TABLET | Freq: Every day | ORAL | Status: DC

## 2012-04-16 MED ORDER — OMEPRAZOLE 20 MG PO CPDR: 20.0000 mg | DELAYED_RELEASE_CAPSULE | Freq: Every day | ORAL | Status: DC

## 2012-04-16 MED ORDER — LORAZEPAM 1 MG PO TABS: ORAL_TABLET | ORAL | Status: DC

## 2012-04-16 MED ORDER — NAPROXEN 375 MG PO TABS: 375.0000 mg | ORAL_TABLET | Freq: Two times a day (BID) | ORAL | Status: DC

## 2012-04-16 NOTE — Assessment & Plan Note (Signed)
He did not mention any tremor with prozac previously, he stopped medication after a nurse with insurance company came to the home.  Start trazodone at bedtime

## 2012-04-16 NOTE — Patient Instructions (Signed)
Stop the iron pills Start new medicine for sleep and mood at bedtime Continue all other medicines New arthritis medicine ,take with food Get the flu shot at pharmacy F/U 4 months

## 2012-04-16 NOTE — Assessment & Plan Note (Signed)
Well controlled 

## 2012-04-16 NOTE — Progress Notes (Signed)
  Subjective:    Patient ID: [name], male    DOB: 12-02-1927, 78 y.o.   MRN: [id number]  HPI  PT here to f/uchronic medical problems , stopped prozac secondary to tremor 3 weeks ago. States that his tremor is now almost gone. He needs help sleeping and still feels depressed and he is now on medication. He does not feel like going back to alcohol. He would like a medication for arthritis of his knee he uses ibuprofen as needed. Labs reviewed  Review of Systems  GEN- denies fatigue, fever, weight loss,weakness, recent illness HEENT- denies eye drainage, change in vision, nasal discharge, CVS- denies chest pain, palpitations RESP- denies SOB, cough, wheeze ABD- denies N/V, change in stools, abd pain GU- denies dysuria, hematuria, dribbling, incontinence MSK- +Occ joint pain, muscle aches, injury Neuro- denies headache, dizziness, syncope, seizure activity      Objective:   Physical Exam GEN- NAD, alert and oriented x3 HEENT- PERRL, EOMI, non injected sclera, pink conjunctiva, MMM, oropharynx clear Neck- Supple, CVS- RRR, no murmur RESP-CTAB EXT- No edema Pulses- Radial 2+ CNII-XII grossly in tact, no focal deficits, mild resting tremor  Psych- normal affect and Mood Skin- multiple nevi    Assessment & Plan:

## 2012-04-16 NOTE — Assessment & Plan Note (Signed)
Start low dose naprosyn BID with meals

## 2012-04-16 NOTE — Assessment & Plan Note (Signed)
Trazadone, did not do well with [place]

## 2012-04-16 NOTE — Assessment & Plan Note (Signed)
Mild resting tremor, he feels this has improved, declines further work up

## 2012-04-16 NOTE — Assessment & Plan Note (Signed)
Unchanged, ativan refilled

## 2012-05-10 ENCOUNTER — Telehealth: Payer: Self-pay

## 2012-05-10 NOTE — Telephone Encounter (Signed)
Pt came in c/o left big toe pain. I looked at the area and it was about the size of a quarter on the bottom of big toe. Was draining a small amount of orange pus. Placed a bandaid over the area and told him I would check and see if you wanted to work him in or call him something in. [phone]

## 2012-05-11 NOTE — Telephone Encounter (Signed)
Called pt to come in and he said his toe was much better and would call back if he needed appt

## 2012-05-11 NOTE — Telephone Encounter (Signed)
Have him come in this AM

## 2012-07-18 ENCOUNTER — Emergency Department (HOSPITAL_COMMUNITY)
Admission: EM | Admit: 2012-07-18 | Discharge: 2012-07-18 | Disposition: A | Discharge status: Home / Self Care | Payer: Medicare Other | Attending: Emergency Medicine | Admitting: Emergency Medicine

## 2012-07-18 ENCOUNTER — Encounter (HOSPITAL_COMMUNITY): Payer: Self-pay | Admitting: *Deleted

## 2012-07-18 DIAGNOSIS — E785 Hyperlipidemia, unspecified: Secondary | ICD-10-CM | POA: Insufficient documentation

## 2012-07-18 DIAGNOSIS — F329 Major depressive disorder, single episode, unspecified: Secondary | ICD-10-CM | POA: Insufficient documentation

## 2012-07-18 DIAGNOSIS — Z860100 Personal history of colon polyps, unspecified: Secondary | ICD-10-CM | POA: Insufficient documentation

## 2012-07-18 DIAGNOSIS — Z8669 Personal history of other diseases of the nervous system and sense organs: Secondary | ICD-10-CM | POA: Insufficient documentation

## 2012-07-18 DIAGNOSIS — F3289 Other specified depressive episodes: Secondary | ICD-10-CM | POA: Insufficient documentation

## 2012-07-18 DIAGNOSIS — F410 Panic disorder [episodic paroxysmal anxiety] without agoraphobia: Secondary | ICD-10-CM | POA: Insufficient documentation

## 2012-07-18 DIAGNOSIS — Z79899 Other long term (current) drug therapy: Secondary | ICD-10-CM | POA: Insufficient documentation

## 2012-07-18 DIAGNOSIS — Z8739 Personal history of other diseases of the musculoskeletal system and connective tissue: Secondary | ICD-10-CM | POA: Insufficient documentation

## 2012-07-18 DIAGNOSIS — L02219 Cutaneous abscess of trunk, unspecified: Secondary | ICD-10-CM | POA: Insufficient documentation

## 2012-07-18 DIAGNOSIS — Z862 Personal history of diseases of the blood and blood-forming organs and certain disorders involving the immune mechanism: Secondary | ICD-10-CM | POA: Insufficient documentation

## 2012-07-18 DIAGNOSIS — F1021 Alcohol dependence, in remission: Secondary | ICD-10-CM | POA: Insufficient documentation

## 2012-07-18 DIAGNOSIS — Z7982 Long term (current) use of aspirin: Secondary | ICD-10-CM | POA: Insufficient documentation

## 2012-07-18 DIAGNOSIS — Z791 Long term (current) use of non-steroidal anti-inflammatories (NSAID): Secondary | ICD-10-CM | POA: Insufficient documentation

## 2012-07-18 DIAGNOSIS — L02212 Cutaneous abscess of back [any part, except buttock]: Secondary | ICD-10-CM

## 2012-07-18 DIAGNOSIS — Z8614 Personal history of Methicillin resistant Staphylococcus aureus infection: Secondary | ICD-10-CM | POA: Insufficient documentation

## 2012-07-18 DIAGNOSIS — Z8719 Personal history of other diseases of the digestive system: Secondary | ICD-10-CM | POA: Insufficient documentation

## 2012-07-18 DIAGNOSIS — Z87891 Personal history of nicotine dependence: Secondary | ICD-10-CM | POA: Insufficient documentation

## 2012-07-18 DIAGNOSIS — R42 Dizziness and giddiness: Secondary | ICD-10-CM | POA: Insufficient documentation

## 2012-07-18 DIAGNOSIS — K219 Gastro-esophageal reflux disease without esophagitis: Secondary | ICD-10-CM | POA: Insufficient documentation

## 2012-07-18 LAB — GLUCOSE, CAPILLARY: Glucose-Capillary: 98 mg/dL (ref 70–99)

## 2012-07-18 MED ORDER — LIDOCAINE-EPINEPHRINE (PF) 1 %-1:200000 IJ SOLN
20.0000 mL | Freq: Once | INTRAMUSCULAR | Status: AC
  Administered 2012-07-18: 20 mL
  Filled 2012-07-18 (×2): qty 10

## 2012-07-18 MED ORDER — BACITRACIN-NEOMYCIN-POLYMYXIN 400-5-5000 EX OINT
TOPICAL_OINTMENT | Freq: Once | CUTANEOUS | Status: AC
  Administered 2012-07-18: 1 via TOPICAL
  Filled 2012-07-18: qty 1

## 2012-07-18 MED ORDER — VANCOMYCIN HCL IN DEXTROSE 1-5 GM/200ML-% IV SOLN
1000.0000 mg | Freq: Once | INTRAVENOUS | Status: AC
  Administered 2012-07-18: 1000 mg via INTRAVENOUS
  Filled 2012-07-18: qty 200

## 2012-07-18 MED ORDER — DOXYCYCLINE HYCLATE 100 MG PO CAPS: 100.0000 mg | ORAL_CAPSULE | Freq: Two times a day (BID) | ORAL | Status: DC

## 2012-07-18 NOTE — ED Notes (Signed)
Very large abscess to upper back, draining at times. Pt states he was dizzy earlier today.

## 2012-07-18 NOTE — ED Notes (Signed)
Pt receiving IV vanc infusion.

## 2012-07-18 NOTE — ED Provider Notes (Signed)
History  This chart was scribed for Donnetta Hutching, MD by Erskine Emery, ED Scribe. This patient was seen in room APA09/APA09 and the patient's care was started at 15:08.   CSN: [id number]  Arrival date & time 07/18/12  1412   First MD Initiated Contact with Patient 07/18/12 1508      Chief Complaint  Patient presents with  . Abscess  . Dizziness    (Consider location/radiation/quality/duration/timing/severity/associated sxs/prior treatment) The history is provided by the patient. No language interpreter was used.  [name] is a 79 y.o. male who presents to the Emergency Department complaining of a draining abscess on his left upper back for the past 3 days. Pt has no h/o similar episodes and no h/o DM. Pt lives by himself and drove himself to the hospital. Severity is moderate to severe. No fever or chills.  Dr. Jeanice Lim is the pt's PCP.  Past Medical History  Diagnosis Date  . Knee pain   . Dizzy   . Panic attacks   . Acid reflux   . Alcoholism /alcohol abuse   . Anemia   . Barrett's esophagus   . Hyperlipidemia   . Hearing impairment   . MRSA (methicillin resistant staph aureus) culture positive     Sepsis of right knee  . H/O: UGI bleed   . Colon polyp   . Thrombocytopenia     Mild  . Depression   . Arthritis     OA    Past Surgical History  Procedure Laterality Date  . Appendectomy    . Tonsillectomy    . Bilateral cataract surgery    . Knee surgery  4.2011    arthroscopic lavage, RIGHT knee for septic arthritis  . Cardiac catheterization      No family history on file.  History  Substance Use Topics  . Smoking status: Former Games developer  . Smokeless tobacco: Not on file  . Alcohol Use: Yes     Comment: occasional with binge drinking      Review of Systems  Skin:       Abscess  All other systems reviewed and are negative.    Allergies  Aspirin  Home Medications   Current Outpatient Rx  Name  Route  Sig  Dispense  Refill  . amLODipine  (NORVASC) 5 MG tablet   Oral   Take 1 tablet (5 mg total) by mouth daily.   30 tablet   3   . aspirin (ASPIRIN LOW DOSE) 81 MG EC tablet   Oral   Take 81 mg by mouth daily.           . Cholecalciferol (VITAMIN D3) 1000 UNITS CAPS   Oral   Take 1 capsule by mouth daily.           Marland Kitchen EXPIRED: FLUoxetine (PROZAC) 20 MG tablet   Oral   Take 1 tablet (20 mg total) by mouth daily.   30 tablet   3   . folic acid (FOLVITE) 1 MG tablet   Oral   Take 1 mg by mouth daily.           Marland Kitchen LORazepam (ATIVAN) 1 MG tablet      TAKE 1 TABLET BY MOUTH THREE TIMES DAILY.   90 tablet   2   . Multiple Vitamin (MULTIVITAMIN) tablet   Oral   Take 1 tablet by mouth daily.           . naproxen (NAPROSYN) 375 MG tablet  Oral   Take 1 tablet (375 mg total) by mouth 2 (two) times daily with a meal.   60 tablet   3   . omeprazole (PRILOSEC) 20 MG capsule   Oral   Take 1 capsule (20 mg total) by mouth daily.   30 capsule   3   . traZODone (DESYREL) 50 MG tablet   Oral   Take 1 tablet (50 mg total) by mouth at bedtime.   30 tablet   3     Triage Vitals: BP 147/75  Pulse 107  Temp(Src) 98 F (36.7 C) (Oral)  Resp 16  Ht 5' 10.5" (1.791 m)  Wt 190 lb (86.183 kg)  BMI 26.87 kg/m2  SpO2 98%  Physical Exam  Nursing note and vitals reviewed. Constitutional: He is oriented to person, place, and time. He appears well-developed and well-nourished.  HENT:  Head: Normocephalic and atraumatic.  Eyes: Conjunctivae and EOM are normal. Pupils are equal, round, and reactive to light.  Neck: Normal range of motion. Neck supple.  Cardiovascular: Normal rate, regular rhythm and normal heart sounds.   Pulmonary/Chest: Effort normal and breath sounds normal.  Abdominal: Soft. Bowel sounds are normal.  Musculoskeletal: Normal range of motion.  Neurological: He is alert and oriented to person, place, and time.  Skin: Skin is warm and dry.  Left upper back: area of erythema approximately  4-5 cm in diameter that is raised with pustules, tender, and indurated.  Psychiatric: He has a normal mood and affect.    ED Course  Procedures (including critical care time) DIAGNOSTIC STUDIES: Oxygen Saturation is 98% on room air, normal by my interpretation.    COORDINATION OF CARE: 15:29--I evaluated the patient and we discussed a treatment plan including incision and drainage, IV antibiotics, and antibiotics po to which the pt agreed.   INCISION AND DRAINAGE PROCEDURE NOTE: Patient identification was confirmed and verbal consent was obtained. This procedure was performed by Donnetta Hutching, MD at 3:49 PM. Site: left upper back Sterile procedures observed: yes Needle size: 25 Anesthetic used (type and amt): 1% lidocaine with epinephrine, 10mL Blade size: 11 Drainage: moderate amount of purulent white-grey material Complexity: Complex Packing used: iodoform gauze Site anesthetized, incision made over site, wound drained and explored loculations, rinsed with copious amounts of normal saline, wound packed with sterile gauze, covered with dry, sterile dressing.  Pt tolerated procedure well without complications.  Instructions for care discussed verbally and pt provided with additional written instructions for homecare and f/u.  I instructed the pt to take a warm bath to soak the area upon returning home and to return on Tuesday for a recheck. I notified him that I packed the area with iodoform gauze and that it is acceptable if it eventually falls out.   Results for orders placed during the hospital encounter of 07/18/12  GLUCOSE, CAPILLARY      Result Value Range   Glucose-Capillary 98  70 - 99 mg/dL      No diagnosis found.    MDM  Incision and drainage of upper back abscess. 1 g IV vancomycin. discharge rx doxycycline 100 mg twice a day for 10 days. Recheck in 2 days.      I personally performed the services described in this documentation, which was scribed in my  presence. The recorded information has been reviewed and is accurate.    Donnetta Hutching, MD 07/18/12 [phone]

## 2012-07-20 ENCOUNTER — Encounter (HOSPITAL_COMMUNITY): Payer: Self-pay

## 2012-07-20 ENCOUNTER — Emergency Department (HOSPITAL_COMMUNITY)
Admission: EM | Admit: 2012-07-20 | Discharge: 2012-07-20 | Disposition: A | Discharge status: Home / Self Care | Payer: Medicare Other | Attending: Emergency Medicine | Admitting: Emergency Medicine

## 2012-07-20 DIAGNOSIS — F329 Major depressive disorder, single episode, unspecified: Secondary | ICD-10-CM | POA: Insufficient documentation

## 2012-07-20 DIAGNOSIS — Z4800 Encounter for change or removal of nonsurgical wound dressing: Secondary | ICD-10-CM | POA: Insufficient documentation

## 2012-07-20 DIAGNOSIS — Z8719 Personal history of other diseases of the digestive system: Secondary | ICD-10-CM | POA: Insufficient documentation

## 2012-07-20 DIAGNOSIS — Z8639 Personal history of other endocrine, nutritional and metabolic disease: Secondary | ICD-10-CM | POA: Insufficient documentation

## 2012-07-20 DIAGNOSIS — Z22322 Carrier or suspected carrier of Methicillin resistant Staphylococcus aureus: Secondary | ICD-10-CM | POA: Insufficient documentation

## 2012-07-20 DIAGNOSIS — H9190 Unspecified hearing loss, unspecified ear: Secondary | ICD-10-CM | POA: Insufficient documentation

## 2012-07-20 DIAGNOSIS — Z7982 Long term (current) use of aspirin: Secondary | ICD-10-CM | POA: Insufficient documentation

## 2012-07-20 DIAGNOSIS — F3289 Other specified depressive episodes: Secondary | ICD-10-CM | POA: Insufficient documentation

## 2012-07-20 DIAGNOSIS — Z860100 Personal history of colon polyps, unspecified: Secondary | ICD-10-CM | POA: Insufficient documentation

## 2012-07-20 DIAGNOSIS — Z87891 Personal history of nicotine dependence: Secondary | ICD-10-CM | POA: Insufficient documentation

## 2012-07-20 DIAGNOSIS — Z862 Personal history of diseases of the blood and blood-forming organs and certain disorders involving the immune mechanism: Secondary | ICD-10-CM | POA: Insufficient documentation

## 2012-07-20 DIAGNOSIS — F410 Panic disorder [episodic paroxysmal anxiety] without agoraphobia: Secondary | ICD-10-CM | POA: Insufficient documentation

## 2012-07-20 DIAGNOSIS — Z5189 Encounter for other specified aftercare: Secondary | ICD-10-CM

## 2012-07-20 DIAGNOSIS — IMO0002 Reserved for concepts with insufficient information to code with codable children: Secondary | ICD-10-CM | POA: Insufficient documentation

## 2012-07-20 DIAGNOSIS — Z791 Long term (current) use of non-steroidal anti-inflammatories (NSAID): Secondary | ICD-10-CM | POA: Insufficient documentation

## 2012-07-20 DIAGNOSIS — K219 Gastro-esophageal reflux disease without esophagitis: Secondary | ICD-10-CM | POA: Insufficient documentation

## 2012-07-20 DIAGNOSIS — Z8739 Personal history of other diseases of the musculoskeletal system and connective tissue: Secondary | ICD-10-CM | POA: Insufficient documentation

## 2012-07-20 NOTE — ED Provider Notes (Signed)
History     CSN: [id number]  Arrival date & time 07/20/12  0915   First MD Initiated Contact with Patient 07/20/12 [phone]      Chief Complaint  Patient presents with  . Wound Check   HPI [name] is a 79 y.o. male who presents to the ED for wound recheck. He had an area drained on his back 2 days ago and instructed to return today for recheck. On that visit he received Vancomycin IV and  Rx for Doxycycline. He reports that the area feels better today and his is taking his medication as directed. The history was provided by the patient.  Past Medical History  Diagnosis Date  . Knee pain   . Dizzy   . Panic attacks   . Acid reflux   . Alcoholism /alcohol abuse   . Anemia   . Barrett's esophagus   . Hyperlipidemia   . Hearing impairment   . MRSA (methicillin resistant staph aureus) culture positive     Sepsis of right knee  . H/O: UGI bleed   . Colon polyp   . Thrombocytopenia     Mild  . Depression   . Arthritis     OA    Past Surgical History  Procedure Laterality Date  . Appendectomy    . Tonsillectomy    . Bilateral cataract surgery    . Knee surgery  4.2011    arthroscopic lavage, RIGHT knee for septic arthritis  . Cardiac catheterization      No family history on file.  History  Substance Use Topics  . Smoking status: Former Games developer  . Smokeless tobacco: Not on file  . Alcohol Use: Yes     Comment: occasional with binge drinking      Review of Systems  Constitutional: Negative for fever and chills.  Respiratory: Negative for cough.   Cardiovascular: Negative for chest pain.  Skin:       Abscess to back.  Psychiatric/Behavioral: Negative for confusion. The patient is not nervous/anxious.     Allergies  Aspirin  Home Medications   Current Outpatient Rx  Name  Route  Sig  Dispense  Refill  . amLODipine (NORVASC) 5 MG tablet   Oral   Take 1 tablet (5 mg total) by mouth daily.   30 tablet   3   . aspirin (ASPIRIN LOW DOSE) 81 MG EC  tablet   Oral   Take 81 mg by mouth daily.           . Cholecalciferol (VITAMIN D3) 1000 UNITS CAPS   Oral   Take 1 capsule by mouth daily.           Marland Kitchen doxycycline (VIBRAMYCIN) 100 MG capsule   Oral   Take 100 mg by mouth 2 (two) times daily. Starting 07/19/2012 x 10 days.         Marland Kitchen FLUoxetine (PROZAC) 20 MG tablet   Oral   Take 20 mg by mouth daily.         . folic acid (FOLVITE) 1 MG tablet   Oral   Take 1 mg by mouth daily.           Marland Kitchen LORazepam (ATIVAN) 1 MG tablet   Oral   Take 1 mg by mouth 3 (three) times daily.         . Multiple Vitamin (MULTIVITAMIN) tablet   Oral   Take 1 tablet by mouth daily.           Marland Kitchen  naproxen (NAPROSYN) 375 MG tablet   Oral   Take 1 tablet (375 mg total) by mouth 2 (two) times daily with a meal.   60 tablet   3   . omeprazole (PRILOSEC) 20 MG capsule   Oral   Take 1 capsule (20 mg total) by mouth daily.   30 capsule   3   . traZODone (DESYREL) 50 MG tablet   Oral   Take 1 tablet (50 mg total) by mouth at bedtime.   30 tablet   3     BP 138/74  Pulse 116  Temp(Src) 97.9 F (36.6 C) (Oral)  Resp 20  SpO2 100%  Physical Exam  Nursing note and vitals reviewed. Constitutional: He is oriented to person, place, and time. He appears well-developed and well-nourished. No distress.  HENT:  Head: Normocephalic.  Eyes: EOM are normal.  Neck: Neck supple.  Cardiovascular:  tachycardia  Pulmonary/Chest: Effort normal.  Musculoskeletal:  Large abscess to upper back with packing in place. Area of erythema surrounding the incision site.  Neurological: He is alert and oriented to person, place, and time.  Psychiatric: He has a normal mood and affect. His behavior is normal. Judgment and thought content normal.   Procedures Packing removed from abscess.  Dr. Adriana Simas in to examine the patient since he did the I&D and will determine if the area has improved and thinks it has improved greatly.  Dr. Adriana Simas gently expressed  more purulent drainage from the area.   Assessment: 79 y.o. male here for recheck of abscess of back   Abscess improved  Plan:  Continue Doxycycline, warm wet compresses, return in 2 days for recheck.     Discussed with the patient and all questioned fully answered.   Medication List    ASK your doctor about these medications       amLODipine 5 MG tablet  Commonly known as:  NORVASC  Take 1 tablet (5 mg total) by mouth daily.     ASPIRIN LOW DOSE 81 MG EC tablet  Generic drug:  aspirin  Take 81 mg by mouth daily.     doxycycline 100 MG capsule  Commonly known as:  VIBRAMYCIN  Take 100 mg by mouth 2 (two) times daily. Starting 07/19/2012 x 10 days.     FLUoxetine 20 MG tablet  Commonly known as:  PROZAC  Take 20 mg by mouth daily.     folic acid 1 MG tablet  Commonly known as:  FOLVITE  Take 1 mg by mouth daily.     LORazepam 1 MG tablet  Commonly known as:  ATIVAN  Take 1 mg by mouth 3 (three) times daily.     multivitamin tablet  Take 1 tablet by mouth daily.     naproxen 375 MG tablet  Commonly known as:  NAPROSYN  Take 1 tablet (375 mg total) by mouth 2 (two) times daily with a meal.     omeprazole 20 MG capsule  Commonly known as:  PRILOSEC  Take 1 capsule (20 mg total) by mouth daily.     traZODone 50 MG tablet  Commonly known as:  DESYREL  Take 1 tablet (50 mg total) by mouth at bedtime.     Vitamin D3 1000 UNITS Caps  Take 1 capsule by mouth daily.                [facility] Orlene Och, [place] 07/20/12 1017

## 2012-07-20 NOTE — ED Notes (Signed)
Pt reports having wound drained on back 2 days ago here today for wound recheck.

## 2012-07-20 NOTE — ED Notes (Addendum)
Hope NP at bedside, bandage and packing removed, odor noted to area, area is large and red, thick yellow drainage noted in center of wound.

## 2012-07-20 NOTE — ED Notes (Signed)
Pt was seen in er two days ago for drainage to wound on upper back area, pt states that the area is better, has old bandage in place on wound, odor noted to bandage,

## 2012-07-20 NOTE — ED Notes (Signed)
Dr. Adriana Simas at bedside, bandage placed on wound by Deanna, NT pt tolerated well

## 2012-07-20 NOTE — ED Notes (Signed)
Patient waiting for discharge

## 2012-07-21 NOTE — ED Provider Notes (Signed)
Medical screening examination/treatment/procedure(s) were conducted as a shared visit with non-physician practitioner(s) and myself.  I personally evaluated the patient during the encounter.  Wound less painful. Still draining pus. Patient states he feels much better. Will recheck in 2 more days.  Continue doxycycline  Donnetta Hutching, MD 07/21/12 [phone]

## 2012-07-22 ENCOUNTER — Encounter (HOSPITAL_COMMUNITY): Payer: Self-pay | Admitting: Emergency Medicine

## 2012-07-22 ENCOUNTER — Emergency Department (HOSPITAL_COMMUNITY)
Admission: EM | Admit: 2012-07-22 | Discharge: 2012-07-22 | Disposition: A | Discharge status: Home / Self Care | Payer: Medicare Other | Attending: Emergency Medicine | Admitting: Emergency Medicine

## 2012-07-22 DIAGNOSIS — E785 Hyperlipidemia, unspecified: Secondary | ICD-10-CM | POA: Insufficient documentation

## 2012-07-22 DIAGNOSIS — Z860100 Personal history of colon polyps, unspecified: Secondary | ICD-10-CM | POA: Insufficient documentation

## 2012-07-22 DIAGNOSIS — Z4801 Encounter for change or removal of surgical wound dressing: Secondary | ICD-10-CM | POA: Insufficient documentation

## 2012-07-22 DIAGNOSIS — Z862 Personal history of diseases of the blood and blood-forming organs and certain disorders involving the immune mechanism: Secondary | ICD-10-CM | POA: Insufficient documentation

## 2012-07-22 DIAGNOSIS — Z8614 Personal history of Methicillin resistant Staphylococcus aureus infection: Secondary | ICD-10-CM | POA: Insufficient documentation

## 2012-07-22 DIAGNOSIS — Z79899 Other long term (current) drug therapy: Secondary | ICD-10-CM | POA: Insufficient documentation

## 2012-07-22 DIAGNOSIS — D649 Anemia, unspecified: Secondary | ICD-10-CM | POA: Insufficient documentation

## 2012-07-22 DIAGNOSIS — Z87891 Personal history of nicotine dependence: Secondary | ICD-10-CM | POA: Insufficient documentation

## 2012-07-22 DIAGNOSIS — Z7982 Long term (current) use of aspirin: Secondary | ICD-10-CM | POA: Insufficient documentation

## 2012-07-22 DIAGNOSIS — F410 Panic disorder [episodic paroxysmal anxiety] without agoraphobia: Secondary | ICD-10-CM | POA: Insufficient documentation

## 2012-07-22 DIAGNOSIS — Z8719 Personal history of other diseases of the digestive system: Secondary | ICD-10-CM | POA: Insufficient documentation

## 2012-07-22 DIAGNOSIS — Z8739 Personal history of other diseases of the musculoskeletal system and connective tissue: Secondary | ICD-10-CM | POA: Insufficient documentation

## 2012-07-22 DIAGNOSIS — K219 Gastro-esophageal reflux disease without esophagitis: Secondary | ICD-10-CM | POA: Insufficient documentation

## 2012-07-22 NOTE — ED Notes (Signed)
Pt has an abscess on back that needs to be rechecked. Was advised by Dr. Adriana Simas to return today so Dr. Adriana Simas could see him. Denies any changes. Nad.

## 2012-07-22 NOTE — ED Notes (Signed)
Applied clean dressing to abcess site.  Patient with no complaints at this time. Respirations even and unlabored. Skin warm/dry. Discharge instructions reviewed with patient at this time. Patient given opportunity to voice concerns/ask questions. Reviewed My chart w/patient.  Patient discharged at this time and left Emergency Department with steady gait.

## 2012-07-22 NOTE — ED Provider Notes (Signed)
History     CSN: [id number]  Arrival date & time 07/22/12  1239   First MD Initiated Contact with Patient 07/22/12 1307      Chief Complaint  Patient presents with  . Follow-up    (Consider location/radiation/quality/duration/timing/severity/associated sxs/prior treatment) HPI.... status post large abscess on upper back approximately 5 or 6 days ago.  Incision and drainage performed by self on Sunday.  Patient is here for wound recheck.   Patient reports no pain. Wound is still oozing pus. No fever or chills.  Past Medical History  Diagnosis Date  . Knee pain   . Dizzy   . Panic attacks   . Acid reflux   . Alcoholism /alcohol abuse   . Anemia   . Barrett's esophagus   . Hyperlipidemia   . Hearing impairment   . MRSA (methicillin resistant staph aureus) culture positive     Sepsis of right knee  . H/O: UGI bleed   . Colon polyp   . Thrombocytopenia     Mild  . Depression   . Arthritis     OA    Past Surgical History  Procedure Laterality Date  . Appendectomy    . Tonsillectomy    . Bilateral cataract surgery    . Knee surgery  4.2011    arthroscopic lavage, RIGHT knee for septic arthritis  . Cardiac catheterization      History reviewed. No pertinent family history.  History  Substance Use Topics  . Smoking status: Former Games developer  . Smokeless tobacco: Not on file  . Alcohol Use: Yes     Comment: occasional with binge drinking      Review of Systems  All other systems reviewed and are negative.    Allergies  Aspirin  Home Medications   Current Outpatient Rx  Name  Route  Sig  Dispense  Refill  . amLODipine (NORVASC) 5 MG tablet   Oral   Take 1 tablet (5 mg total) by mouth daily.   30 tablet   3   . aspirin (ASPIRIN LOW DOSE) 81 MG EC tablet   Oral   Take 81 mg by mouth daily.           . Cholecalciferol (VITAMIN D3) 1000 UNITS CAPS   Oral   Take 1 capsule by mouth daily.           Marland Kitchen doxycycline (VIBRAMYCIN) 100 MG capsule    Oral   Take 100 mg by mouth 2 (two) times daily. Starting 07/19/2012 x 10 days.         Marland Kitchen FLUoxetine (PROZAC) 20 MG tablet   Oral   Take 20 mg by mouth daily.         . folic acid (FOLVITE) 1 MG tablet   Oral   Take 1 mg by mouth daily.           Marland Kitchen LORazepam (ATIVAN) 1 MG tablet   Oral   Take 1 mg by mouth 3 (three) times daily.         . Multiple Vitamin (MULTIVITAMIN) tablet   Oral   Take 1 tablet by mouth daily.           . naproxen (NAPROSYN) 375 MG tablet   Oral   Take 1 tablet (375 mg total) by mouth 2 (two) times daily with a meal.   60 tablet   3   . omeprazole (PRILOSEC) 20 MG capsule   Oral   Take 1 capsule (20 mg  total) by mouth daily.   30 capsule   3   . traZODone (DESYREL) 50 MG tablet   Oral   Take 1 tablet (50 mg total) by mouth at bedtime.   30 tablet   3     BP 149/80  Pulse 100  Temp(Src) 97.7 F (36.5 C) (Oral)  Resp 20  Ht 5\' 10"  (1.778 m)  Wt 190 lb (86.183 kg)  BMI 27.26 kg/m2  SpO2 98%  Physical Exam  Constitutional: He is oriented to person, place, and time. He appears well-developed and well-nourished.  Musculoskeletal: Normal range of motion.  Neurological: He is alert and oriented to person, place, and time.  Skin:  Abscess upper back: Area of erythema and induration has reduced to approximately 4-5 cm in diameter.  Central incision approximately 1 cm in diameter continues to ooze pus.  Psychiatric: He has a normal mood and affect.    ED Course  Procedures (including critical care time)  Labs Reviewed - No data to display No results found.   1. Abscess of back       MDM  Wound is greatly improved from 5 days ago.  Continue treatment plan. I will recheck him on Tuesday, February 25        Donnetta Hutching, MD 07/22/12 1321

## 2012-07-27 ENCOUNTER — Other Ambulatory Visit (HOSPITAL_COMMUNITY)
Admission: RE | Admit: 2012-07-27 | Discharge: 2012-07-27 | Disposition: A | Discharge status: Home / Self Care | Payer: Medicare Other | Source: Ambulatory Visit | Attending: General Surgery | Admitting: General Surgery

## 2012-07-27 ENCOUNTER — Encounter: Payer: Self-pay | Admitting: Family Medicine

## 2012-07-27 ENCOUNTER — Telehealth: Payer: Self-pay | Admitting: Family Medicine

## 2012-07-27 ENCOUNTER — Ambulatory Visit (INDEPENDENT_AMBULATORY_CARE_PROVIDER_SITE_OTHER): Payer: Medicare Other | Admitting: Family Medicine

## 2012-07-27 VITALS — BP 140/72 | HR 95 | Resp 18 | Ht 70.0 in | Wt 187.0 lb

## 2012-07-27 DIAGNOSIS — F3289 Other specified depressive episodes: Secondary | ICD-10-CM

## 2012-07-27 DIAGNOSIS — K219 Gastro-esophageal reflux disease without esophagitis: Secondary | ICD-10-CM

## 2012-07-27 DIAGNOSIS — L0291 Cutaneous abscess, unspecified: Secondary | ICD-10-CM

## 2012-07-27 DIAGNOSIS — F329 Major depressive disorder, single episode, unspecified: Secondary | ICD-10-CM

## 2012-07-27 DIAGNOSIS — L02219 Cutaneous abscess of trunk, unspecified: Secondary | ICD-10-CM | POA: Insufficient documentation

## 2012-07-27 DIAGNOSIS — F411 Generalized anxiety disorder: Secondary | ICD-10-CM

## 2012-07-27 DIAGNOSIS — I10 Essential (primary) hypertension: Secondary | ICD-10-CM

## 2012-07-27 DIAGNOSIS — F419 Anxiety disorder, unspecified: Secondary | ICD-10-CM

## 2012-07-27 DIAGNOSIS — F32A Depression, unspecified: Secondary | ICD-10-CM

## 2012-07-27 MED ORDER — TRAZODONE HCL 50 MG PO TABS: 50.0000 mg | ORAL_TABLET | Freq: Every day | ORAL | Status: DC

## 2012-07-27 MED ORDER — OMEPRAZOLE 20 MG PO CPDR: 20.0000 mg | DELAYED_RELEASE_CAPSULE | Freq: Every day | ORAL | Status: DC

## 2012-07-27 MED ORDER — AMLODIPINE BESYLATE 10 MG PO TABS: 10.0000 mg | ORAL_TABLET | Freq: Every day | ORAL | Status: DC

## 2012-07-27 MED ORDER — LORAZEPAM 1 MG PO TABS: 1.0000 mg | ORAL_TABLET | Freq: Three times a day (TID) | ORAL | Status: DC

## 2012-07-27 NOTE — Assessment & Plan Note (Signed)
Large skin abscess of his upper back. Based on the appearance and the duration of time is still not healing after reviewing the emergency room notes. We have called for urgent appointment with Gen. surgery this morning. He will complete the doxycycline unless given further instructions by the surgeon

## 2012-07-27 NOTE — Telephone Encounter (Signed)
I spoke with [name] this afternoon. I advised him he should hold his aspirin I actually increased his blood pressure medication , not the aspirin. He did have another incision and drainage by general surgery 2 days therefore holding the aspirin 81 mg will be helpful. He is followup in the morning

## 2012-07-27 NOTE — Assessment & Plan Note (Signed)
Blood pressure has been elevated I will increase his Norvasc 10 mg

## 2012-07-27 NOTE — Assessment & Plan Note (Signed)
Currently stable on Prilosec will continue as he benefits from this

## 2012-07-27 NOTE — Patient Instructions (Signed)
Go directly to Dr. Bertram Savin office to have abscess looked at Blood pressure medication increased to 10mg   Ativan refilled today F/U 3 months

## 2012-07-27 NOTE — Assessment & Plan Note (Signed)
Unchanged, stable, continue ativan he has no side effects to medication, such as drowsiness, headache

## 2012-07-27 NOTE — Progress Notes (Signed)
  Subjective:    Patient ID: [name], male    DOB: 05/13/1928, 79 y.o.   MRN: [id number]  HPI Patient presents to follow chronic medical problems. He was seen on the ED on February 16 secondary to large abscess on his back which an incision and drainage was performed. He's was placed on doxycycline after given 1 g of vancomycin in the ER. He's been following up every couple of days he denies any pain but the abscess continues to drain some.  He's taken all his medications per report however he did not bring the bottles with him. He states his mood has been good and he is sleeping well   Review of Systems - per above   GEN- denies fatigue, fever, weight loss,weakness, recent illness HEENT- denies eye drainage, change in vision, nasal discharge, CVS- denies chest pain, palpitations RESP- denies SOB, cough, wheeze ABD- denies N/V, change in stools, abd pain GU- denies dysuria, hematuria, dribbling, incontinence MSK- + joint pain, muscle aches, injury Neuro- denies headache, dizziness, syncope, seizure activity      Objective:   Physical Exam   GEN- NAD, alert and oriented x3 HEENT- PERRL, EOMI, non injected sclera, pink conjunctiva, MMM, oropharynx clear Neck- Supple,  CVS- RRR, no murmur RESP-CTAB Skin- Large 6cm area of erythema with 1cm incision in center with mild pus draining, NT, +induration, no foul odor  EXT- No edema Pulses- Radial, DP- 2+ Psych- normal affect and mood       Assessment & Plan:

## 2012-07-27 NOTE — Assessment & Plan Note (Signed)
Doing well on trazodone will continue at current dose

## 2012-08-13 ENCOUNTER — Ambulatory Visit: Payer: Medicare Other | Admitting: Family Medicine

## 2012-09-02 ENCOUNTER — Telehealth: Payer: Self-pay | Admitting: Family Medicine

## 2012-09-02 MED ORDER — NAPROXEN 375 MG PO TABS: 375.0000 mg | ORAL_TABLET | Freq: Two times a day (BID) | ORAL | Status: DC

## 2012-09-02 NOTE — Telephone Encounter (Signed)
Med sent in.

## 2012-10-04 ENCOUNTER — Ambulatory Visit (INDEPENDENT_AMBULATORY_CARE_PROVIDER_SITE_OTHER): Payer: Medicare Other | Admitting: Family Medicine

## 2012-10-04 ENCOUNTER — Encounter: Payer: Self-pay | Admitting: Family Medicine

## 2012-10-04 VITALS — BP 140/74 | HR 92 | Resp 18 | Wt 189.0 lb

## 2012-10-04 DIAGNOSIS — E785 Hyperlipidemia, unspecified: Secondary | ICD-10-CM

## 2012-10-04 DIAGNOSIS — L0291 Cutaneous abscess, unspecified: Secondary | ICD-10-CM

## 2012-10-04 DIAGNOSIS — I10 Essential (primary) hypertension: Secondary | ICD-10-CM

## 2012-10-04 MED ORDER — SULFAMETHOXAZOLE-TRIMETHOPRIM 800-160 MG PO TABS: 1.0000 | ORAL_TABLET | Freq: Two times a day (BID) | ORAL | Status: AC

## 2012-10-04 NOTE — Progress Notes (Signed)
  Subjective:    Patient ID: [name], male    DOB: 02-21-1928, 79 y.o.   MRN: [id number]  HPI   Pt here with recurrent abscess. States [facility] nurse has been checking on him, a few days ago noticed bumps on his back draining and one large one of his left leg. Tender to touch, no fever, no N/V. Taking all other medications as prescribed.  Review of Systems  GEN- denies fatigue, fever, weight loss,weakness, recent illness HEENT- denies eye drainage, change in vision, nasal discharge, CVS- denies chest pain, palpitations RESP- denies SOB, cough, wheeze ABD- denies N/V, change in stools, abd pain Neuro- denies headache, dizziness, syncope, seizure activity      Objective:   Physical Exam    GEN-NAD, alert and oriented x 3  HEENT- MMM, no oral lesions  Skin- Back- Left side  3 1cm abscess with yellow pus, induration and erythema surroudning, Left upper thigh- lateral aspect- large abscess with multiple heads, draining yellow pus, surrounding induration and erythema  Ext- no edema       Assessment & Plan:

## 2012-10-04 NOTE — Assessment & Plan Note (Signed)
BP stable, no change to meds

## 2012-10-04 NOTE — Patient Instructions (Signed)
Take the antibiotics Go straight to Dr. Bertram Savin, office

## 2012-10-04 NOTE — Assessment & Plan Note (Signed)
Multiple skin abscess, pt referred to general surgery, will be seen today for I and D, the one on his legs looks quite deep Start bactrim Check A1C, CBC, HIV for recurrent infections

## 2012-10-05 ENCOUNTER — Other Ambulatory Visit: Payer: Self-pay | Admitting: Family Medicine

## 2012-10-05 LAB — HIV ANTIBODY (ROUTINE TESTING W REFLEX): HIV: NONREACTIVE

## 2012-10-05 LAB — CBC WITH DIFFERENTIAL/PLATELET
Hemoglobin: 12.9 g/dL — ABNORMAL LOW (ref 13.0–17.0)
Lymphs Abs: 1.2 10*3/uL (ref 0.7–4.0)
Monocytes Relative: 9 % (ref 3–12)
Neutro Abs: 3.9 10*3/uL (ref 1.7–7.7)
Neutrophils Relative %: 64 % (ref 43–77)
RBC: 4.42 MIL/uL (ref 4.22–5.81)

## 2012-10-05 LAB — COMPREHENSIVE METABOLIC PANEL
Albumin: 4.1 g/dL (ref 3.5–5.2)
Alkaline Phosphatase: 97 U/L (ref 39–117)
BUN: 24 mg/dL — ABNORMAL HIGH (ref 6–23)
Glucose, Bld: 104 mg/dL — ABNORMAL HIGH (ref 70–99)
Total Bilirubin: 0.5 mg/dL (ref 0.3–1.2)

## 2012-10-05 LAB — LIPID PANEL
Cholesterol: 160 mg/dL (ref 0–200)
HDL: 39 mg/dL — ABNORMAL LOW (ref >39)
Total CHOL/HDL Ratio: 4.1 Ratio

## 2012-10-07 LAB — HEMOGLOBIN A1C: Mean Plasma Glucose: 120 mg/dL — ABNORMAL HIGH (ref <117)

## 2012-10-22 ENCOUNTER — Other Ambulatory Visit: Payer: Self-pay

## 2012-10-22 MED ORDER — LORAZEPAM 1 MG PO TABS: 1.0000 mg | ORAL_TABLET | Freq: Three times a day (TID) | ORAL | Status: DC

## 2012-10-26 ENCOUNTER — Encounter: Payer: Self-pay | Admitting: Family Medicine

## 2012-10-26 ENCOUNTER — Ambulatory Visit (INDEPENDENT_AMBULATORY_CARE_PROVIDER_SITE_OTHER): Payer: Medicare Other | Admitting: Family Medicine

## 2012-10-26 VITALS — BP 140/76 | HR 88 | Resp 16 | Ht 70.0 in | Wt 188.1 lb

## 2012-10-26 DIAGNOSIS — E785 Hyperlipidemia, unspecified: Secondary | ICD-10-CM

## 2012-10-26 DIAGNOSIS — F3289 Other specified depressive episodes: Secondary | ICD-10-CM

## 2012-10-26 DIAGNOSIS — F419 Anxiety disorder, unspecified: Secondary | ICD-10-CM

## 2012-10-26 DIAGNOSIS — G4700 Insomnia, unspecified: Secondary | ICD-10-CM

## 2012-10-26 DIAGNOSIS — M179 Osteoarthritis of knee, unspecified: Secondary | ICD-10-CM

## 2012-10-26 DIAGNOSIS — M1710 Unilateral primary osteoarthritis, unspecified knee: Secondary | ICD-10-CM

## 2012-10-26 DIAGNOSIS — F329 Major depressive disorder, single episode, unspecified: Secondary | ICD-10-CM

## 2012-10-26 DIAGNOSIS — IMO0002 Reserved for concepts with insufficient information to code with codable children: Secondary | ICD-10-CM

## 2012-10-26 DIAGNOSIS — I10 Essential (primary) hypertension: Secondary | ICD-10-CM

## 2012-10-26 DIAGNOSIS — F411 Generalized anxiety disorder: Secondary | ICD-10-CM

## 2012-10-26 DIAGNOSIS — F32A Depression, unspecified: Secondary | ICD-10-CM

## 2012-10-26 MED ORDER — TRAZODONE HCL 100 MG PO TABS: 100.0000 mg | ORAL_TABLET | Freq: Every day | ORAL | Status: DC

## 2012-10-26 MED ORDER — AMLODIPINE BESYLATE 10 MG PO TABS: 10.0000 mg | ORAL_TABLET | Freq: Every day | ORAL | Status: DC

## 2012-10-26 NOTE — Assessment & Plan Note (Signed)
Trazodone increased to 100 mg  

## 2012-10-26 NOTE — Assessment & Plan Note (Signed)
Doing well on Naprosyn twice a day

## 2012-10-26 NOTE — Progress Notes (Signed)
  Subjective:    Patient ID: [name], male    DOB: [birth year], 79 y.o.   MRN: [id number]  HPI Patient here follow chronic medical problems. He has no specific concerns. He is still under the care of surgeon for recurrent skin abscess. He also has a home health nurse He is tolerating his medications but does not think his trazodone and strong enough. He has not been able to sleep the past few weeks his daughter is also hospitalized and is not doing well. He continues to use his lorazepam as needed He continues to use his anti-inflammatory for his knee pain  Review of Systems - per above   GEN- denies fatigue, fever, weight loss,weakness, recent illness HEENT- denies eye drainage, change in vision, nasal discharge, CVS- denies chest pain, palpitations RESP- denies SOB, cough, wheeze ABD- denies N/V, change in stools, abd pain GU- denies dysuria, hematuria, dribbling, incontinence MSK- +joint pain, muscle aches, injury Neuro- denies headache, dizziness, syncope, seizure activity      Objective:   Physical Exam GEN- NAD, alert and oriented x3 HEENT- PERRL, EOMI, non injected sclera, pink conjunctiva, MMM, oropharynx clear Neck- Supple,, no LAD CVS- RRR, no murmur RESP-CTAB ABD-NABS,soft,NT,ND EXT- No edema Pulses- Radial, DP- 2+ Psych- normal affect and mood       Assessment & Plan:

## 2012-10-26 NOTE — Assessment & Plan Note (Signed)
Deteriorated some, increase trazodone 100 mg

## 2012-10-26 NOTE — Assessment & Plan Note (Signed)
Continue Ativan he has no medication side effects

## 2012-10-26 NOTE — Assessment & Plan Note (Signed)
Blood pressure looks good for patient greater than 65 no history of coronary artery disease

## 2012-10-26 NOTE — Patient Instructions (Addendum)
Schedule an appt with Dr. Nile Riggs  Restart aspirin in 2 weeks  trazodone increased to 100mg  for sleep and depression F/U 3 months [employer]

## 2012-10-26 NOTE — Assessment & Plan Note (Signed)
Cholesterol looks good no further intervention

## 2012-12-31 ENCOUNTER — Telehealth: Payer: Self-pay | Admitting: Family Medicine

## 2012-12-31 MED ORDER — NAPROXEN 375 MG PO TABS: 375.0000 mg | ORAL_TABLET | Freq: Two times a day (BID) | ORAL | Status: DC

## 2012-12-31 NOTE — Telephone Encounter (Signed)
Medication refilled per protocol. 

## 2013-01-17 ENCOUNTER — Telehealth: Payer: Self-pay | Admitting: Family Medicine

## 2013-01-17 MED ORDER — LORAZEPAM 1 MG PO TABS: 1.0000 mg | ORAL_TABLET | Freq: Three times a day (TID) | ORAL | Status: DC

## 2013-01-17 NOTE — Telephone Encounter (Signed)
Please let pt know we will call pharmacy to get medication filled early I am sorry to hear about his daughter

## 2013-01-17 NOTE — Telephone Encounter (Signed)
Pt aware.

## 2013-01-18 ENCOUNTER — Telehealth: Payer: Self-pay | Admitting: Family Medicine

## 2013-01-18 NOTE — Telephone Encounter (Signed)
?   OK to Refill  

## 2013-01-18 NOTE — Telephone Encounter (Signed)
i called this in yesterday

## 2013-01-18 NOTE — Telephone Encounter (Signed)
Lorazepam 1 mg tab 1 TID #90 last rf 12/22/12

## 2013-02-02 ENCOUNTER — Ambulatory Visit (INDEPENDENT_AMBULATORY_CARE_PROVIDER_SITE_OTHER): Payer: Medicare Other | Admitting: Family Medicine

## 2013-02-02 ENCOUNTER — Encounter: Payer: Self-pay | Admitting: Family Medicine

## 2013-02-02 VITALS — BP 120/60 | HR 96 | Temp 96.7°F | Resp 20 | Wt 188.0 lb

## 2013-02-02 DIAGNOSIS — F3289 Other specified depressive episodes: Secondary | ICD-10-CM

## 2013-02-02 DIAGNOSIS — F32A Depression, unspecified: Secondary | ICD-10-CM

## 2013-02-02 DIAGNOSIS — F419 Anxiety disorder, unspecified: Secondary | ICD-10-CM

## 2013-02-02 DIAGNOSIS — IMO0002 Reserved for concepts with insufficient information to code with codable children: Secondary | ICD-10-CM

## 2013-02-02 DIAGNOSIS — M1710 Unilateral primary osteoarthritis, unspecified knee: Secondary | ICD-10-CM

## 2013-02-02 DIAGNOSIS — F411 Generalized anxiety disorder: Secondary | ICD-10-CM

## 2013-02-02 DIAGNOSIS — F329 Major depressive disorder, single episode, unspecified: Secondary | ICD-10-CM

## 2013-02-02 DIAGNOSIS — M179 Osteoarthritis of knee, unspecified: Secondary | ICD-10-CM

## 2013-02-02 MED ORDER — ESCITALOPRAM OXALATE 10 MG PO TABS: 10.0000 mg | ORAL_TABLET | Freq: Every day | ORAL | Status: DC

## 2013-02-02 MED ORDER — TRAMADOL-ACETAMINOPHEN 37.5-325 MG PO TABS: 1.0000 | ORAL_TABLET | Freq: Two times a day (BID) | ORAL | Status: DC | PRN

## 2013-02-02 NOTE — Assessment & Plan Note (Signed)
Per above, start lexapro

## 2013-02-02 NOTE — Progress Notes (Signed)
  Subjective:    Patient ID: [name], male    DOB: 1927-11-08, 79 y.o.   MRN: [id number]  HPI  Patient here to followup depression and knee pain. His daughter died a few weeks ago. He states that his medications are not helping and he feels very depressed. He's taking his ativan as prescribed but it tends to wear off in the afternoon and he gets a sick feeling in the stomach and feels like crying. He is also not sleeping as well but he did not have his trazodone last night.  He complains of recurrent knee pain was taking Naprosyn twice a day but this does not helping. 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 He denies any swelling of the knees. He does have history of MRSA infection in his right knee 3 years ago.States they are stiff and painful with standing or bending to the knee  Review of Systems   GEN- denies fatigue, fever, weight loss,weakness, recent illness HEENT- denies eye drainage, change in vision, nasal discharge, CVS- denies chest pain, palpitations RESP- denies SOB, cough, wheeze ABD- denies N/V, change in stools, abd pain GU- denies dysuria, hematuria, dribbling, incontinence MSK- + joint pain, muscle aches, injury Neuro- denies headache, dizziness, syncope, seizure activity      Objective:   Physical Exam  GEN- NAD, alert and oriented x3 HEENT- PERRL, EOMI, non injected sclera, pink conjunctiva, MMM, oropharynx clear CVS- RRR, no murmur RESP-CTAB ABD-NABS,soft,NT,ND MSK- Bilateral knee, no effusion, +crepitus bilat, NT to palpation, fair ROM bilat knees EXT- No edema Pulses- Radial, DP- 2+ Psych- crying discussed daughter, depressed affect, not anxious appearing,no apparent SI, good eye contact  Procedure- Knee injection Procedure explained to patient questions answered benefits and risks discussed written consent obtained. Antiseptic-ETOH wipe Anesthesia-Cold Spray Left knee injection- Kenolog ,  marcaine, lidocaine  1:2:2 Minimal blood loss Patient tolerated procedure well Bandage applied       Assessment & Plan:

## 2013-02-02 NOTE — Patient Instructions (Addendum)
Start ultracet twice a day for pain Start lexapro once a day in morning for depression Knee injection F/U 4 weeks Knee Injection Joint injections are shots. Your caregiver will place a needle into your knee joint. The needle is used to put medicine into the joint. These shots can be used to help treat different painful knee conditions such as osteoarthritis, bursitis, local flare-ups of rheumatoid arthritis, and pseudogout. Anti-inflammatory medicines such as corticosteroids and anesthetics are the most common medicines used for joint and soft tissue injections.  PROCEDURE  The skin over the kneecap will be cleaned with an antiseptic solution.  Your caregiver will inject a small amount of a local anesthetic (a medicine like Novocaine) just under the skin in the area that was cleaned.  After the area becomes numb, a second injection is done. This second injection usually includes an anesthetic and an anti-inflammatory medicine called a steroid or cortisone. The needle is carefully placed in between the kneecap and the knee, and the medicine is injected into the joint space.  After the injection is done, the needle is removed. Your caregiver may place a bandage over the injection site. The whole procedure takes no more than a couple of minutes. BEFORE THE PROCEDURE  Wash all of the skin around the entire knee area. Try to remove any loose, scaling skin. There is no other specific preparation necessary unless advised otherwise by your caregiver. LET YOUR CAREGIVER KNOW ABOUT:   Allergies.  Medications taken including herbs, eye drops, over the counter medications, and creams.  Use of steroids (by mouth or creams).  Possible pregnancy, if applicable.  Previous problems with anesthetics or Novocaine.  History of blood clots (thrombophlebitis).  History of bleeding or blood problems.  Previous surgery.  Other health problems. RISKS AND COMPLICATIONS Side effects from cortisone shots  are rare. They include:   Slight bruising of the skin.  Shrinkage of the normal fatty tissue under the skin where the shot was given.  Increase in pain after the shot.  Infection.  Weakening of tendons or tendon rupture.  Allergic reaction to the medicine.  Diabetics may have a temporary increase in their blood sugar after a shot.  Cortisone can temporarily weaken the immune system. While receiving these shots, you should not get certain vaccines. Also, avoid contact with anyone who has chickenpox or measles. Especially if you have never had these diseases or have not been previously immunized. Your immune system may not be strong enough to fight off the infection while the cortisone is in your system. AFTER THE PROCEDURE   You can go home after the procedure.  You may need to put ice on the joint 15-20 minutes every 3 or 4 hours until the pain goes away.  You may need to put an elastic bandage on the joint. HOME CARE INSTRUCTIONS   Only take over-the-counter or prescription medicines for pain, discomfort, or fever as directed by your caregiver.  You should avoid stressing the joint. Unless advised otherwise, avoid activities that put a lot of pressure on a knee joint, such as:  Jogging.  Bicycling.  Recreational climbing.  Hiking.  Laying down and elevating the leg/knee above the level of your heart can help to minimize swelling. SEEK MEDICAL CARE IF:   You have repeated or worsening swelling.  There is drainage from the puncture area.  You develop red streaking that extends above or below the site where the needle was inserted. SEEK IMMEDIATE MEDICAL CARE IF:   You  develop a fever.  You have pain that gets worse even though you are taking pain medicine.  The area is red and warm, and you have trouble moving the joint. MAKE SURE YOU:   Understand these instructions.  Will watch your condition.  Will get help right away if you are not doing well or get  worse. Document Released: 08/10/2006 Document Revised: 08/11/2011 Document Reviewed: 05/07/2007 [facility] Patient Information 2014 [place], [place].

## 2013-02-02 NOTE — Assessment & Plan Note (Addendum)
Cortisone injection to left knee, no previous infection in this knee.  Change to ultracet BID for pain

## 2013-02-02 NOTE — Assessment & Plan Note (Signed)
Worsened with death of his daughter, will try him on Lexapro, He agrees to try another medication Continue trazodone at bedtime and ativan I would truly like to taper him off ativan if I can get him on  A stable SSRI/SSNI, this has been very difficult

## 2013-03-02 ENCOUNTER — Encounter: Payer: Self-pay | Admitting: Family Medicine

## 2013-03-02 ENCOUNTER — Ambulatory Visit (INDEPENDENT_AMBULATORY_CARE_PROVIDER_SITE_OTHER): Payer: Medicare Other | Admitting: Family Medicine

## 2013-03-02 VITALS — BP 138/70 | HR 100 | Temp 97.1°F | Resp 20 | Wt 188.0 lb

## 2013-03-02 DIAGNOSIS — IMO0002 Reserved for concepts with insufficient information to code with codable children: Secondary | ICD-10-CM

## 2013-03-02 DIAGNOSIS — M1710 Unilateral primary osteoarthritis, unspecified knee: Secondary | ICD-10-CM

## 2013-03-02 DIAGNOSIS — Z23 Encounter for immunization: Secondary | ICD-10-CM

## 2013-03-02 DIAGNOSIS — F3289 Other specified depressive episodes: Secondary | ICD-10-CM

## 2013-03-02 DIAGNOSIS — M179 Osteoarthritis of knee, unspecified: Secondary | ICD-10-CM

## 2013-03-02 DIAGNOSIS — F32A Depression, unspecified: Secondary | ICD-10-CM

## 2013-03-02 DIAGNOSIS — F329 Major depressive disorder, single episode, unspecified: Secondary | ICD-10-CM

## 2013-03-02 NOTE — Assessment & Plan Note (Signed)
Continue Lexapro at current dose. Continue Ativan and trazodone for sleep

## 2013-03-02 NOTE — Patient Instructions (Addendum)
Continue current medications Use ultracet for pain in knees as needed Flu shot given F/U 3 months

## 2013-03-02 NOTE — Progress Notes (Signed)
  Subjective:    Patient ID: [name], male    DOB: [birth year], 79 y.o.   MRN: [id number]  HPI  Patient here for interim visit. 4 weeks ago started on Lexapro 10 mg secondary to worsening depression and anxiety especially surrounding the recent death of his daughter. He states he is doing well on the Lexapro and feels like his mood has improved. His sleep is also improved. He still taking Ativan as directed. He is getting up and going to the senior center to spend times with friends on a regular basis  Osteoarthritis-last visit he was given injection in the left knee he did well with this. He states his pain has improved. He uses Ultracet twice a day which helps his pain he is able to do his activities   He has dermatology appointment next week for routine mole check   Review of Systems - per above   GEN- denies fatigue, fever, weight loss,weakness, recent illness MSK- denies joint pain, muscle aches, injury Neuro- denies headache, dizziness, syncope, seizure activity      Objective:   Physical Exam  GEN- NAD, alert and oriented x3 CVS- RRR, no murmur RESP-CTAB MSK- Bilateral knee, no effusion, +crepitus bilat,  fair ROM bilat knees EXT- No edema Psych- not depressed or anxious appearing today, no apparent SI, good eye contact         Assessment & Plan:

## 2013-03-02 NOTE — Assessment & Plan Note (Signed)
Doing well status post injection continue Ultracet

## 2013-03-11 ENCOUNTER — Telehealth: Payer: Self-pay | Admitting: Family Medicine

## 2013-03-11 NOTE — Telephone Encounter (Signed)
Patient wanted to let you know that he had went to Dr.Hall today and he said everything looks fine.

## 2013-03-11 NOTE — Telephone Encounter (Signed)
noted 

## 2013-03-15 ENCOUNTER — Other Ambulatory Visit: Payer: Self-pay | Admitting: Family Medicine

## 2013-03-15 MED ORDER — OMEPRAZOLE 20 MG PO CPDR: 20.0000 mg | DELAYED_RELEASE_CAPSULE | Freq: Every day | ORAL | Status: DC

## 2013-04-20 ENCOUNTER — Telehealth: Payer: Self-pay | Admitting: Family Medicine

## 2013-04-20 NOTE — Telephone Encounter (Signed)
Patient needs his Ativan refilled . Patient  Is completely out.   [employer]

## 2013-04-20 NOTE — Telephone Encounter (Signed)
Ok to refill 

## 2013-04-20 NOTE — Telephone Encounter (Signed)
Okay to refill give 3 

## 2013-04-21 NOTE — Telephone Encounter (Signed)
Jasmine December called RX out

## 2013-05-03 ENCOUNTER — Telehealth: Payer: Self-pay | Admitting: Family Medicine

## 2013-05-03 DIAGNOSIS — F329 Major depressive disorder, single episode, unspecified: Secondary | ICD-10-CM

## 2013-05-03 MED ORDER — TRAZODONE HCL 100 MG PO TABS: 100.0000 mg | ORAL_TABLET | Freq: Every day | ORAL | Status: DC

## 2013-05-03 MED ORDER — ESCITALOPRAM OXALATE 10 MG PO TABS: 10.0000 mg | ORAL_TABLET | Freq: Every day | ORAL | Status: DC

## 2013-05-03 MED ORDER — TRAMADOL-ACETAMINOPHEN 37.5-325 MG PO TABS: 1.0000 | ORAL_TABLET | Freq: Two times a day (BID) | ORAL | Status: DC | PRN

## 2013-05-03 MED ORDER — LORAZEPAM 1 MG PO TABS: 1.0000 mg | ORAL_TABLET | Freq: Three times a day (TID) | ORAL | Status: DC

## 2013-05-03 NOTE — Telephone Encounter (Signed)
Pt needing his lexapro, ultracet and trazadone.[name] to refill?

## 2013-05-03 NOTE — Telephone Encounter (Signed)
Pt is needing a refill Depression medicine,pain medicine, bed time medicine  Call back number is [phone] Pharmacy [facility]

## 2013-05-03 NOTE — Telephone Encounter (Signed)
Okay to refill all medications, refill ativan if due

## 2013-05-03 NOTE — Telephone Encounter (Signed)
Meds refilled.

## 2013-06-03 ENCOUNTER — Telehealth: Payer: Self-pay | Admitting: Family Medicine

## 2013-06-03 MED ORDER — AMLODIPINE BESYLATE 10 MG PO TABS: 10.0000 mg | ORAL_TABLET | Freq: Every day | ORAL | Status: DC

## 2013-06-03 MED ORDER — NAPROXEN 375 MG PO TABS: 375.0000 mg | ORAL_TABLET | Freq: Two times a day (BID) | ORAL | Status: DC

## 2013-06-03 NOTE — Telephone Encounter (Signed)
One month refills until upcoming appt

## 2013-06-08 ENCOUNTER — Ambulatory Visit: Payer: Medicare Other | Admitting: Family Medicine

## 2013-06-10 ENCOUNTER — Ambulatory Visit (INDEPENDENT_AMBULATORY_CARE_PROVIDER_SITE_OTHER): Payer: Medicare Other | Admitting: Family Medicine

## 2013-06-10 ENCOUNTER — Encounter: Payer: Self-pay | Admitting: Family Medicine

## 2013-06-10 VITALS — BP 122/58 | HR 100 | Temp 97.7°F | Resp 20 | Ht 68.0 in | Wt 196.0 lb

## 2013-06-10 DIAGNOSIS — IMO0002 Reserved for concepts with insufficient information to code with codable children: Secondary | ICD-10-CM

## 2013-06-10 DIAGNOSIS — K117 Disturbances of salivary secretion: Secondary | ICD-10-CM

## 2013-06-10 DIAGNOSIS — M1710 Unilateral primary osteoarthritis, unspecified knee: Secondary | ICD-10-CM

## 2013-06-10 DIAGNOSIS — E785 Hyperlipidemia, unspecified: Secondary | ICD-10-CM

## 2013-06-10 DIAGNOSIS — M179 Osteoarthritis of knee, unspecified: Secondary | ICD-10-CM

## 2013-06-10 DIAGNOSIS — R682 Dry mouth, unspecified: Secondary | ICD-10-CM | POA: Insufficient documentation

## 2013-06-10 DIAGNOSIS — G4700 Insomnia, unspecified: Secondary | ICD-10-CM

## 2013-06-10 DIAGNOSIS — F329 Major depressive disorder, single episode, unspecified: Secondary | ICD-10-CM

## 2013-06-10 DIAGNOSIS — F3289 Other specified depressive episodes: Secondary | ICD-10-CM

## 2013-06-10 DIAGNOSIS — I10 Essential (primary) hypertension: Secondary | ICD-10-CM

## 2013-06-10 DIAGNOSIS — F32A Depression, unspecified: Secondary | ICD-10-CM

## 2013-06-10 DIAGNOSIS — Z23 Encounter for immunization: Secondary | ICD-10-CM

## 2013-06-10 MED ORDER — TRAMADOL HCL 50 MG PO TABS: 50.0000 mg | ORAL_TABLET | Freq: Two times a day (BID) | ORAL | Status: DC | PRN

## 2013-06-10 MED ORDER — OMEPRAZOLE 20 MG PO CPDR: 20.0000 mg | DELAYED_RELEASE_CAPSULE | Freq: Every day | ORAL | Status: DC

## 2013-06-10 MED ORDER — AMLODIPINE BESYLATE 10 MG PO TABS: 10.0000 mg | ORAL_TABLET | Freq: Every day | ORAL | Status: DC

## 2013-06-10 NOTE — Assessment & Plan Note (Signed)
Will change him to tramadol 50 mg twice a day he can take acetaminophen one tablet twice a day in addition

## 2013-06-10 NOTE — Assessment & Plan Note (Signed)
Check lipid panel and other fasting labs

## 2013-06-10 NOTE — Assessment & Plan Note (Signed)
Doing well on Lexapro and Ativan

## 2013-06-10 NOTE — Assessment & Plan Note (Signed)
I think this is likely due to his medications including his blood pressure medication. He will try biotin products over-the-counter

## 2013-06-10 NOTE — Patient Instructions (Signed)
Prevnar 13 given New dose of Ultram 50mg  twice a day for arthritis pain Try the BIOTIN for dry mouth-over the counter Get the labs done - Fasting  F/U 4 months

## 2013-06-10 NOTE — Progress Notes (Signed)
   Subjective:    Patient ID: [name], male    DOB: 04/29/1928, 80 y.o.   MRN: [id number]  HPI Patient here follow chronic medical problems. He has no specific concerns today. Continues to suffer with his osteoarthritis of his knees on and off. He would like to dose of Ultram to be increased to help with his pain.  Depression/anxiety he is doing well on his Lexapro and trazodone. He also continues to use Ativan without any difficulties. He has not had any dizzy spells or headaches.  He does get occasional dry mouth.   Review of Systems  GEN- denies fatigue, fever, weight loss,weakness, recent illness HEENT- denies eye drainage, change in vision, nasal discharge, CVS- denies chest pain, palpitations RESP- denies SOB, cough, wheeze ABD- denies N/V, change in stools, abd pain GU- denies dysuria, hematuria, dribbling, incontinence MSK- + joint pain, muscle aches, injury Neuro- denies headache, dizziness, syncope, seizure activity      Objective:   Physical Exam GEN- NAD, alert and oriented x3 HEENT- PERRL, EOMI, non injected sclera, pink conjunctiva, MMM, oropharynx clear Neck- Supple, no bruit CVS- RRR, no murmur RESP-CTAB ABD-NABS,soft,NT,ND EXT- No edema Pulses- Radial 2+ Psych- normal affect and mood       Assessment & Plan:

## 2013-06-10 NOTE — Assessment & Plan Note (Signed)
Doing well on trazodone we'll continue

## 2013-06-10 NOTE — Assessment & Plan Note (Signed)
Blood pressure is well-controlled no change in medication 

## 2013-06-24 LAB — COMPREHENSIVE METABOLIC PANEL
ALBUMIN: 4.1 g/dL (ref 3.5–5.2)
ALK PHOS: 68 U/L (ref 39–117)
ALT: 12 U/L (ref 0–53)
AST: 15 U/L (ref 0–37)
BUN: 19 mg/dL (ref 6–23)
CALCIUM: 9 mg/dL (ref 8.4–10.5)
CO2: 25 mEq/L (ref 19–32)
CREATININE: 0.95 mg/dL (ref 0.50–1.35)
Chloride: 104 mEq/L (ref 96–112)
GLUCOSE: 98 mg/dL (ref 70–99)
POTASSIUM: 4.4 meq/L (ref 3.5–5.3)
Sodium: 137 mEq/L (ref 135–145)
Total Bilirubin: 0.7 mg/dL (ref 0.3–1.2)
Total Protein: 6.6 g/dL (ref 6.0–8.3)

## 2013-06-24 LAB — CBC
HEMATOCRIT: 39 % (ref 39.0–52.0)
HEMOGLOBIN: 13.6 g/dL (ref 13.0–17.0)
MCH: 30.2 pg (ref 26.0–34.0)
MCHC: 34.9 g/dL (ref 30.0–36.0)
MCV: 86.7 fL (ref 78.0–100.0)
Platelets: 189 10*3/uL (ref 150–400)
RBC: 4.5 MIL/uL (ref 4.22–5.81)
RDW: 14.2 % (ref 11.5–15.5)
WBC: 5.3 10*3/uL (ref 4.0–10.5)

## 2013-06-24 LAB — LIPID PANEL
CHOL/HDL RATIO: 4.7 ratio
CHOLESTEROL: 196 mg/dL (ref 0–200)
HDL: 42 mg/dL (ref >39)
LDL CALC: 135 mg/dL — AB (ref 0–99)
TRIGLYCERIDES: 94 mg/dL (ref <150)
VLDL: 19 mg/dL (ref 0–40)

## 2013-06-27 ENCOUNTER — Encounter: Payer: Self-pay | Admitting: *Deleted

## 2013-09-05 ENCOUNTER — Other Ambulatory Visit: Payer: Self-pay | Admitting: Family Medicine

## 2013-09-05 NOTE — Telephone Encounter (Signed)
Okay to refill? 

## 2013-09-05 NOTE — Telephone Encounter (Signed)
Ok to refill??  Last office visit/ refill 06/10/2013.

## 2013-09-05 NOTE — Telephone Encounter (Signed)
Medication called to pharmacy. 

## 2013-10-05 ENCOUNTER — Other Ambulatory Visit: Payer: Self-pay | Admitting: Family Medicine

## 2013-10-05 NOTE — Telephone Encounter (Signed)
Okay, give 2 refills 

## 2013-10-05 NOTE — Telephone Encounter (Signed)
Medication called to pharmacy. 

## 2013-10-05 NOTE — Telephone Encounter (Signed)
Ok to refill??  Last office visit 06/10/2013.  Last refill 09/05/2013.

## 2013-10-10 ENCOUNTER — Ambulatory Visit (INDEPENDENT_AMBULATORY_CARE_PROVIDER_SITE_OTHER): Payer: Medicare Other | Admitting: Family Medicine

## 2013-10-10 ENCOUNTER — Other Ambulatory Visit: Payer: Self-pay | Admitting: *Deleted

## 2013-10-10 ENCOUNTER — Encounter: Payer: Self-pay | Admitting: Family Medicine

## 2013-10-10 ENCOUNTER — Telehealth: Payer: Self-pay | Admitting: *Deleted

## 2013-10-10 VITALS — BP 136/74 | HR 88 | Temp 97.7°F | Resp 16 | Ht 68.0 in | Wt 195.0 lb

## 2013-10-10 DIAGNOSIS — IMO0002 Reserved for concepts with insufficient information to code with codable children: Secondary | ICD-10-CM

## 2013-10-10 DIAGNOSIS — G4700 Insomnia, unspecified: Secondary | ICD-10-CM

## 2013-10-10 DIAGNOSIS — M179 Osteoarthritis of knee, unspecified: Secondary | ICD-10-CM

## 2013-10-10 DIAGNOSIS — F32A Depression, unspecified: Secondary | ICD-10-CM

## 2013-10-10 DIAGNOSIS — M1710 Unilateral primary osteoarthritis, unspecified knee: Secondary | ICD-10-CM

## 2013-10-10 DIAGNOSIS — F411 Generalized anxiety disorder: Secondary | ICD-10-CM

## 2013-10-10 DIAGNOSIS — F3289 Other specified depressive episodes: Secondary | ICD-10-CM

## 2013-10-10 DIAGNOSIS — F419 Anxiety disorder, unspecified: Secondary | ICD-10-CM

## 2013-10-10 DIAGNOSIS — F329 Major depressive disorder, single episode, unspecified: Secondary | ICD-10-CM

## 2013-10-10 MED ORDER — HYDROCODONE-ACETAMINOPHEN 5-325 MG PO TABS: 1.0000 | ORAL_TABLET | Freq: Two times a day (BID) | ORAL | Status: DC

## 2013-10-10 MED ORDER — DOXEPIN HCL 25 MG PO CAPS: 25.0000 mg | ORAL_CAPSULE | Freq: Every day | ORAL | Status: DC

## 2013-10-10 MED ORDER — LORAZEPAM 1 MG PO TABS
1.0000 mg | ORAL_TABLET | Freq: Three times a day (TID) | ORAL | Status: DC
Start: 2013-10-10 — End: 2013-12-16

## 2013-10-10 MED ORDER — NAPROXEN 375 MG PO TABS: ORAL_TABLET | ORAL | Status: DC

## 2013-10-10 NOTE — Patient Instructions (Signed)
Stop the trazodone Stop the Tramadol For knee- Use Ice three times a day for pain and swelling Take naprosyn twice a day for inflammation Take hydrocodone as needed for pain  New sleep and depression medication- Doxepin at bedtime F/U 2 months

## 2013-10-10 NOTE — Progress Notes (Signed)
Patient ID: [name], male   DOB: Nov 12, 1927, 80 y.o.   MRN: [id number]   Subjective:    Patient ID: [name], male    DOB: [birth year], 80 y.o.   MRN: [id number]  Patient presents for 4 month F/U, Depression medication review and Discuss Cortisone shot to R knee  history of follow chronic medical problems. He continues to have difficulties with his sleep and states that his mood is not very good due to last stressors in the family and his daughter. 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 He actually stopped the Lexapro states it was not helping. He does not think the trazodone helped either for his sleep. He is taking his lorazepam on a regular basis. He denies any suicidal ideations. Denies any hallucinations, to try something different to help his sleep and his mood.  Right knee pain he is history of osteoporosis of both knees. 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 She requested a knee injection in his knee. He is taking tramadol for the pain but it is not helping     Review Of Systems:  GEN- denies fatigue, fever, weight loss,weakness, recent illness HEENT- denies eye drainage, change in vision, nasal discharge, CVS- denies chest pain, palpitations RESP- denies SOB, cough, wheeze ABD- denies N/V, change in stools, abd pain GU- denies dysuria, hematuria, dribbling, incontinence MSK- + joint pain, muscle aches, injury Neuro- denies headache, dizziness, syncope, seizure activity       Objective:    BP 136/74  Pulse 88  Temp(Src) 97.7 F (36.5 C) (Oral)  Resp 16  Ht 5\' 8"  (1.727 m)  Wt 195 lb (88.451 kg)  BMI 29.66 kg/m2 GEN- NAD, alert and oriented x3 HEENT- PERRL, EOMI, non injected sclera, pink conjunctiva, MMM, oropharynx clear GEN- NAD, alert and oriented x3 CVS- RRR, no murmur RESP-CTAB MSK- Bilateral knee, mild TTP  inferior border patella -right knee, no effusion, +crepitus bilat,  fair ROM bilat knees Psych- not depressed or anxious appearing, normal affect, no apparent SI, good eye contact EXT- No edema Pulses- Radial, DP- 2+        Assessment & Plan:      Problem List Items Addressed This Visit   None      Note: This dictation was prepared with Dragon dictation along with smaller phrase technology. Any transcriptional errors that result from this process are unintentional.

## 2013-10-10 NOTE — Assessment & Plan Note (Signed)
Trial of doxepin 25 mg

## 2013-10-10 NOTE — Telephone Encounter (Signed)
Received PA request for Doxepin from pharmacy.   PA submitted.

## 2013-10-10 NOTE — Assessment & Plan Note (Signed)
Start doexpin 25mg  at bedtime He has tried Zoloft, Prozac, Lexapro, trazodone

## 2013-10-10 NOTE — Assessment & Plan Note (Signed)
Continue lorazepam 

## 2013-10-10 NOTE — Telephone Encounter (Signed)
Received PA determination.   PA approved.   [id number]  10/10/2013- 10/11/2014.  Pharmacy made aware.

## 2013-10-10 NOTE — Telephone Encounter (Signed)
Refill appropriate and filled per protocol. 

## 2013-10-10 NOTE — Assessment & Plan Note (Addendum)
Change to norco BID prn No steroid injection due to h/o of septic arthritis, I do not feel comfortable giving him one Naprosyn BID D/C ultram

## 2013-11-09 ENCOUNTER — Telehealth: Payer: Self-pay | Admitting: Family Medicine

## 2013-11-09 MED ORDER — HYDROCODONE-ACETAMINOPHEN 5-325 MG PO TABS: 1.0000 | ORAL_TABLET | Freq: Two times a day (BID) | ORAL | Status: DC

## 2013-11-09 NOTE — Telephone Encounter (Signed)
Prescription printed and patient made aware to come to office to pick up.  

## 2013-11-09 NOTE — Telephone Encounter (Signed)
Ok to refill??  Last office visit/ refill 10/10/2013.

## 2013-11-09 NOTE — Telephone Encounter (Signed)
Okay to refill? 

## 2013-11-09 NOTE — Telephone Encounter (Signed)
[phone]  PT is needing a refill on HYDROcodone-acetaminophen (NORCO) 5-325 MG per tablet

## 2013-12-12 ENCOUNTER — Ambulatory Visit: Payer: Medicare Other | Admitting: Family Medicine

## 2013-12-16 ENCOUNTER — Encounter: Payer: Self-pay | Admitting: Family Medicine

## 2013-12-16 ENCOUNTER — Ambulatory Visit (INDEPENDENT_AMBULATORY_CARE_PROVIDER_SITE_OTHER): Payer: Medicare Other | Admitting: Family Medicine

## 2013-12-16 VITALS — BP 140/78 | HR 82 | Temp 98.1°F | Resp 14 | Ht 68.0 in | Wt 196.0 lb

## 2013-12-16 DIAGNOSIS — G4700 Insomnia, unspecified: Secondary | ICD-10-CM

## 2013-12-16 DIAGNOSIS — F419 Anxiety disorder, unspecified: Secondary | ICD-10-CM

## 2013-12-16 DIAGNOSIS — M170 Bilateral primary osteoarthritis of knee: Secondary | ICD-10-CM

## 2013-12-16 DIAGNOSIS — F411 Generalized anxiety disorder: Secondary | ICD-10-CM

## 2013-12-16 DIAGNOSIS — I10 Essential (primary) hypertension: Secondary | ICD-10-CM

## 2013-12-16 DIAGNOSIS — F329 Major depressive disorder, single episode, unspecified: Secondary | ICD-10-CM

## 2013-12-16 DIAGNOSIS — F32A Depression, unspecified: Secondary | ICD-10-CM

## 2013-12-16 DIAGNOSIS — Z0000 Encounter for general adult medical examination without abnormal findings: Secondary | ICD-10-CM | POA: Insufficient documentation

## 2013-12-16 DIAGNOSIS — M1710 Unilateral primary osteoarthritis, unspecified knee: Secondary | ICD-10-CM

## 2013-12-16 DIAGNOSIS — F3289 Other specified depressive episodes: Secondary | ICD-10-CM

## 2013-12-16 LAB — CBC WITH DIFFERENTIAL/PLATELET
Basophils Absolute: 0.1 10*3/uL (ref 0.0–0.1)
Basophils Relative: 1 % (ref 0–1)
EOS PCT: 1 % (ref 0–5)
Eosinophils Absolute: 0.1 10*3/uL (ref 0.0–0.7)
HCT: 41.8 % (ref 39.0–52.0)
HEMOGLOBIN: 14.4 g/dL (ref 13.0–17.0)
LYMPHS ABS: 1.3 10*3/uL (ref 0.7–4.0)
Lymphocytes Relative: 19 % (ref 12–46)
MCH: 29.1 pg (ref 26.0–34.0)
MCHC: 34.4 g/dL (ref 30.0–36.0)
MCV: 84.4 fL (ref 78.0–100.0)
MONOS PCT: 11 % (ref 3–12)
Monocytes Absolute: 0.7 10*3/uL (ref 0.1–1.0)
NEUTROS PCT: 68 % (ref 43–77)
Neutro Abs: 4.6 10*3/uL (ref 1.7–7.7)
Platelets: 233 10*3/uL (ref 150–400)
RBC: 4.95 MIL/uL (ref 4.22–5.81)
RDW: 14.6 % (ref 11.5–15.5)
WBC: 6.8 10*3/uL (ref 4.0–10.5)

## 2013-12-16 LAB — COMPREHENSIVE METABOLIC PANEL
ALT: 19 U/L (ref 0–53)
AST: 24 U/L (ref 0–37)
Albumin: 4.3 g/dL (ref 3.5–5.2)
Alkaline Phosphatase: 98 U/L (ref 39–117)
BUN: 16 mg/dL (ref 6–23)
CO2: 22 meq/L (ref 19–32)
Calcium: 9.2 mg/dL (ref 8.4–10.5)
Chloride: 102 mEq/L (ref 96–112)
Creat: 0.91 mg/dL (ref 0.50–1.35)
Glucose, Bld: 110 mg/dL — ABNORMAL HIGH (ref 70–99)
Potassium: 4 mEq/L (ref 3.5–5.3)
Sodium: 136 mEq/L (ref 135–145)
TOTAL PROTEIN: 7.2 g/dL (ref 6.0–8.3)
Total Bilirubin: 1.3 mg/dL — ABNORMAL HIGH (ref 0.2–1.2)

## 2013-12-16 LAB — LIPID PANEL
CHOLESTEROL: 175 mg/dL (ref 0–200)
HDL: 44 mg/dL (ref >39)
LDL Cholesterol: 94 mg/dL (ref 0–99)
Total CHOL/HDL Ratio: 4 Ratio
Triglycerides: 187 mg/dL — ABNORMAL HIGH (ref <150)
VLDL: 37 mg/dL (ref 0–40)

## 2013-12-16 MED ORDER — LORAZEPAM 1 MG PO TABS: 1.0000 mg | ORAL_TABLET | Freq: Three times a day (TID) | ORAL | Status: DC

## 2013-12-16 MED ORDER — OMEPRAZOLE 20 MG PO CPDR: 20.0000 mg | DELAYED_RELEASE_CAPSULE | Freq: Every day | ORAL | Status: DC

## 2013-12-16 MED ORDER — AMLODIPINE BESYLATE 10 MG PO TABS: 10.0000 mg | ORAL_TABLET | Freq: Every day | ORAL | Status: DC

## 2013-12-16 MED ORDER — DOXEPIN HCL 25 MG PO CAPS: 25.0000 mg | ORAL_CAPSULE | Freq: Every day | ORAL | Status: DC

## 2013-12-16 MED ORDER — NAPROXEN 375 MG PO TABS: ORAL_TABLET | ORAL | Status: DC

## 2013-12-16 MED ORDER — HYDROCODONE-ACETAMINOPHEN 5-325 MG PO TABS: 1.0000 | ORAL_TABLET | Freq: Four times a day (QID) | ORAL | Status: DC | PRN

## 2013-12-16 NOTE — Assessment & Plan Note (Signed)
Well controlled 

## 2013-12-16 NOTE — Assessment & Plan Note (Signed)
Wellness done Fasting labs

## 2013-12-16 NOTE — Assessment & Plan Note (Signed)
Given 80 of hydrocodone Does not want surgical intervention

## 2013-12-16 NOTE — Assessment & Plan Note (Signed)
Continue doxepin, doing well

## 2013-12-16 NOTE — Patient Instructions (Signed)
We will call with lab results Continue current medications F/U 3 months  

## 2013-12-16 NOTE — Assessment & Plan Note (Signed)
Continue doxepin and ativan

## 2013-12-16 NOTE — Progress Notes (Signed)
Patient ID: [name], male   DOB: 05/10/1928, 80 y.o.   MRN: [id number] Subjective:   Patient presents for Medicare Annual/Subsequent preventive examination.  Patient here for physical exam. He's been doing fairly well. The hydrocodone helps his severe arthritis of his knees however he occasionally needs to take one in the middle of the day or less a few more pain pills to assist with this. He's tried to stay active and is still doing a lot of yard work around his home. He thinks that the doxepin is helping him sleep much better than previously. He also continues to take his Ativan. Review Past Medical/Family/Social: Per EMR   Risk Factors  Current exercise habits: Walks Dietary issues discussed: None  Cardiac risk factors: HTN  Depression Screen  (Note: if answer to either of the following is "Yes", a more complete depression screening is indicated)  Over the past two weeks, have you felt down, depressed or hopeless? Yes Over the past two weeks, have you felt little interest or pleasure in doing things? No Have you lost interest or pleasure in daily life? No Do you often feel hopeless? No Do you cry easily over simple problems? No   Activities of Daily Living  In your present state of health, do you have any difficulty performing the following activities?:  Driving? No  Managing money? No  Feeding yourself? No  Getting from bed to chair? No  Climbing a flight of stairs? No  Preparing food and eating?: No  Bathing or showering? No  Getting dressed: No  Getting to the toilet? No  Using the toilet:No  Moving around from place to place: No  In the past year have you fallen or had a near fall?:No  Are you sexually active? No  Do you have more than one partner? No   Hearing Difficulties: No  Do you often ask people to speak up or repeat themselves? Yes, has seen audiology in past  Do you experience ringing or noises in your ears? No Do you have difficulty understanding soft  or whispered voices? No  Do you feel that you have a problem with memory? No Do you often misplace items? No  Do you feel safe at home? Yes  Cognitive Testing  Alert? Yes Normal Appearance?Yes  Oriented to person? Yes Place? Yes  Time? Yes  Recall of three objects? Yes  Can perform simple calculations? Yes  Displays appropriate judgment?Yes  Can read the correct time from a watch face?Yes   List the Names of Other Physician/Practitioners you currently use: Dr. Luna Glasgow, Dermatology   Indicate any recent Medical Services you may have received from other than [facility] providers in the past year (date may be approximate).   Screening Tests / Date Colonoscopy -UTD                   Zostavax - UTD -per pt report  Influenza Vaccine -Due in fall Tetanus/tdap- unable due to finances   GEN- denies fatigue, fever, weight loss,weakness, recent illness HEENT- denies eye drainage, change in vision, nasal discharge, CVS- denies chest pain, palpitations RESP- denies SOB, cough, wheeze ABD- denies N/V, change in stools, abd pain GU- denies dysuria, hematuria, dribbling, incontinence MSK- + joint pain, muscle aches, injury Neuro- denies headache, dizziness, syncope, seizure activity  GEN- NAD, alert and oriented x3 HEENT- PERRL, EOMI, non injected sclera, pink conjunctiva, MMM, oropharynx clear GEN- NAD, alert and oriented x3 CVS- RRR, no murmur RESP-CTAB MSK- Bilateral knee,no effusion, +crepitus  bilat,  fair ROM bilat knees Psych- not depressed or anxious appearing, normal affect,  EXT- No edema Pulses- Radial 2+    Assessment:    Annual wellness medicare exam   Plan:    During the course of the visit the patient was educated and counseled about appropriate screening and preventive services including:   Treated for depression    Diet review for nutrition referral? Yes ____ Not Indicated __x__  Patient Instructions (the written plan) was given to the patient.  Medicare  Attestation  I have personally reviewed:  The patient's medical and social history  Their use of alcohol, tobacco or illicit drugs  Their current medications and supplements  The patient's functional ability including ADLs,fall risks, home safety risks, cognitive, and hearing and visual impairment  Diet and physical activities  Evidence for depression or mood disorders  The patient's weight, height, BMI, and visual acuity have been recorded in the chart. I have made referrals, counseling, and provided education to the patient based on review of the above and I have provided the patient with a written personalized care plan for preventive services.

## 2013-12-19 ENCOUNTER — Encounter: Payer: Self-pay | Admitting: *Deleted

## 2014-01-12 ENCOUNTER — Other Ambulatory Visit: Payer: Self-pay | Admitting: *Deleted

## 2014-01-12 ENCOUNTER — Telehealth: Payer: Self-pay | Admitting: Family Medicine

## 2014-01-12 MED ORDER — HYDROCODONE-ACETAMINOPHEN 5-325 MG PO TABS: 1.0000 | ORAL_TABLET | Freq: Four times a day (QID) | ORAL | Status: DC | PRN

## 2014-01-12 NOTE — Telephone Encounter (Signed)
Prescription printed and patient made aware to come to office to pick up.  

## 2014-01-12 NOTE — Telephone Encounter (Signed)
okay

## 2014-01-12 NOTE — Telephone Encounter (Signed)
Ok to refill??  Last office visit/ refill 12/16/2013.

## 2014-01-12 NOTE — Telephone Encounter (Signed)
Patient is calling to get refill on his hydrocodone  [phone]

## 2014-02-02 ENCOUNTER — Telehealth: Payer: Self-pay | Admitting: Family Medicine

## 2014-02-02 NOTE — Telephone Encounter (Signed)
[phone] Patient requesting refill on his hydrocodone if possible   He said please call him when ready to pick up

## 2014-02-02 NOTE — Telephone Encounter (Signed)
Ok to refill??  Last office visit 12/16/2013.  Last refill 01/12/2014.

## 2014-02-03 MED ORDER — HYDROCODONE-ACETAMINOPHEN 5-325 MG PO TABS: 1.0000 | ORAL_TABLET | Freq: Three times a day (TID) | ORAL | Status: DC | PRN

## 2014-02-03 NOTE — Telephone Encounter (Signed)
Prescription printed and patient made aware to come to office to pick up.  

## 2014-02-03 NOTE — Telephone Encounter (Signed)
Please call patient is not safe for him to take more than 2 or 3 a day on this narcotic medication. He can go ahead and pick up the prescription today but understands and this must last 30 days Change the prescription so that he says one tablet 3 times a day as needed #80

## 2014-02-03 NOTE — Telephone Encounter (Signed)
Too early to refill, he received 80 tablets on 8/13 , can not refill until the 12th

## 2014-02-03 NOTE — Telephone Encounter (Signed)
Patient also made aware of MD recommendations.

## 2014-02-03 NOTE — Telephone Encounter (Signed)
Call placed to patient and patient made aware.   States that 80 tablets were enough to get him through 20 days. States that he has a lot of arthritis pain and is taking medication 3-4 times daily.   States that medication does help to control pain, but does not resolve it completely. Requested to either increase quantity or change medication.   MD please advise.

## 2014-03-06 ENCOUNTER — Telehealth: Payer: Self-pay | Admitting: Family Medicine

## 2014-03-06 MED ORDER — HYDROCODONE-ACETAMINOPHEN 5-325 MG PO TABS: 1.0000 | ORAL_TABLET | Freq: Three times a day (TID) | ORAL | Status: DC | PRN

## 2014-03-06 NOTE — Telephone Encounter (Signed)
Patient would like rx for his pain medication and to talk to someone about getting a handicap placard for his car  [phone]

## 2014-03-06 NOTE — Telephone Encounter (Signed)
Ok to refill??  Last office visit 12/16/2013.  Last refill 02/03/2014.

## 2014-03-06 NOTE — Telephone Encounter (Signed)
Okay to refill? 

## 2014-03-06 NOTE — Telephone Encounter (Signed)
Prescription printed and patient made aware to come to office to pick up.  

## 2014-03-13 ENCOUNTER — Other Ambulatory Visit: Payer: Self-pay | Admitting: Family Medicine

## 2014-03-13 NOTE — Telephone Encounter (Signed)
Ok to refill??  Last office visit/ refill 12/16/2013, #2 refills.

## 2014-03-13 NOTE — Telephone Encounter (Signed)
Okay to refill? 

## 2014-03-13 NOTE — Telephone Encounter (Signed)
Medication called to pharmacy. 

## 2014-04-05 ENCOUNTER — Telehealth: Payer: Self-pay | Admitting: Family Medicine

## 2014-04-05 MED ORDER — HYDROCODONE-ACETAMINOPHEN 5-325 MG PO TABS: 1.0000 | ORAL_TABLET | Freq: Three times a day (TID) | ORAL | Status: DC | PRN

## 2014-04-05 NOTE — Telephone Encounter (Signed)
Prescription printed and patient made aware to come to office to pick up.  

## 2014-04-05 NOTE — Telephone Encounter (Signed)
Ok to refill??  Last office visit 12/16/2013.  Last refill 03/06/2014.

## 2014-04-05 NOTE — Telephone Encounter (Signed)
Okay to refill? 

## 2014-04-05 NOTE — Telephone Encounter (Signed)
[phone] when ready   Patient is calling to get refill on hydrocodone  If possible

## 2014-04-28 ENCOUNTER — Other Ambulatory Visit: Payer: Self-pay | Admitting: Family Medicine

## 2014-05-02 ENCOUNTER — Encounter: Payer: Self-pay | Admitting: Family Medicine

## 2014-05-02 NOTE — Telephone Encounter (Signed)
Medication refill for one time only.  Patient needs to be seen.  Letter sent for patient to call and schedule 

## 2014-05-05 ENCOUNTER — Telehealth: Payer: Self-pay | Admitting: Family Medicine

## 2014-05-05 MED ORDER — HYDROCODONE-ACETAMINOPHEN 5-325 MG PO TABS: 1.0000 | ORAL_TABLET | Freq: Three times a day (TID) | ORAL | Status: DC | PRN

## 2014-05-05 NOTE — Telephone Encounter (Signed)
Okay to refill? 

## 2014-05-05 NOTE — Telephone Encounter (Signed)
Patient returned call and made aware.

## 2014-05-05 NOTE — Telephone Encounter (Signed)
Prescription printed.   Call placed to patient. No answer. No VM. 

## 2014-05-05 NOTE — Telephone Encounter (Signed)
Ok to refill??  Last office visit 12/16/2013.  Last refill 04/05/2014.

## 2014-05-05 NOTE — Telephone Encounter (Signed)
[phone] Patient is calling for rx of his hydrocodone

## 2014-05-09 ENCOUNTER — Encounter: Payer: Self-pay | Admitting: Family Medicine

## 2014-05-09 ENCOUNTER — Ambulatory Visit (INDEPENDENT_AMBULATORY_CARE_PROVIDER_SITE_OTHER): Payer: Medicare Other | Admitting: Family Medicine

## 2014-05-09 VITALS — BP 136/62 | HR 72 | Temp 99.1°F | Resp 16 | Ht 68.0 in | Wt 201.0 lb

## 2014-05-09 DIAGNOSIS — E785 Hyperlipidemia, unspecified: Secondary | ICD-10-CM

## 2014-05-09 DIAGNOSIS — F419 Anxiety disorder, unspecified: Secondary | ICD-10-CM

## 2014-05-09 DIAGNOSIS — Z23 Encounter for immunization: Secondary | ICD-10-CM

## 2014-05-09 DIAGNOSIS — I10 Essential (primary) hypertension: Secondary | ICD-10-CM

## 2014-05-09 DIAGNOSIS — G4700 Insomnia, unspecified: Secondary | ICD-10-CM

## 2014-05-09 DIAGNOSIS — M170 Bilateral primary osteoarthritis of knee: Secondary | ICD-10-CM

## 2014-05-09 MED ORDER — LORAZEPAM 1 MG PO TABS: 1.0000 mg | ORAL_TABLET | Freq: Three times a day (TID) | ORAL | Status: DC

## 2014-05-09 MED ORDER — DOXEPIN HCL 25 MG PO CAPS: 25.0000 mg | ORAL_CAPSULE | Freq: Every day | ORAL | Status: DC

## 2014-05-09 NOTE — Assessment & Plan Note (Signed)
Continue low dose naprosyn and norco

## 2014-05-09 NOTE — Progress Notes (Signed)
Patient ID: [name], male   DOB: February 28, 1928, 80 y.o.   MRN: [id number]   Subjective:    Patient ID: [name], male    DOB: Jan 20, 1928, 80 y.o.   MRN: [id number]  Patient presents for F/U  Pt here to f/u, no new concerns today. All medications reviewed, discussed use of arthritis medication with meals twice a day, he is doing well with this, also uses hydrocodone as needed. Anxiety is well controlled, he did loose his last script for doxepin. Flu shot    Review Of Systems:  GEN- denies fatigue, fever, weight loss,weakness, recent illness HEENT- denies eye drainage, change in vision, nasal discharge, CVS- denies chest pain, palpitations RESP- denies SOB, cough, wheeze ABD- denies N/V, change in stools, abd pain GU- denies dysuria, hematuria, dribbling, incontinence MSK- +joint pain, muscle aches, injury Neuro- denies headache, dizziness, syncope, seizure activity       Objective:    BP 136/62 mmHg  Pulse 72  Temp(Src) 99.1 F (37.3 C) (Oral)  Resp 16  Ht 5\' 8"  (1.727 m)  Wt 201 lb (91.173 kg)  BMI 30.57 kg/m2 GEN- NAD, alert and oriented x3 HEENT- PERRL, EOMI, non injected sclera, pink conjunctiva, MMM, oropharynx clear CVS- RRR, no murmur RESP-CTAB EXT- No edema Pulses- Radial 2+        Assessment & Plan:      Problem List Items Addressed This Visit    OA (osteoarthritis) of knee   Hypertension   Hyperlipidemia   Anxiety    Other Visit Diagnoses    Need for prophylactic vaccination and inoculation against influenza    -  Primary    Relevant Orders       Flu Vaccine QUAD 36+ mos IM (Completed)       Note: This dictation was prepared with Dragon dictation along with smaller phrase technology. Any transcriptional errors that result from this process are unintentional.

## 2014-05-09 NOTE — Assessment & Plan Note (Signed)
Well controlled 

## 2014-05-09 NOTE — Patient Instructions (Signed)
Continue current medications Naprosyn is twice a day with food F/U 4 months

## 2014-05-09 NOTE — Assessment & Plan Note (Signed)
Well controlled no change to meds 

## 2014-05-09 NOTE — Assessment & Plan Note (Signed)
Restart doxepin at bedtime

## 2014-06-01 ENCOUNTER — Other Ambulatory Visit: Payer: Self-pay | Admitting: Family Medicine

## 2014-06-05 ENCOUNTER — Telehealth: Payer: Self-pay | Admitting: Family Medicine

## 2014-06-05 MED ORDER — HYDROCODONE-ACETAMINOPHEN 5-325 MG PO TABS: 1.0000 | ORAL_TABLET | Freq: Three times a day (TID) | ORAL | Status: DC | PRN

## 2014-06-05 NOTE — Telephone Encounter (Signed)
[phone] Pt is needing a refill on HYDROcodone-acetaminophen (NORCO) 5-325 MG per tablet

## 2014-06-05 NOTE — Telephone Encounter (Signed)
Prescription printed and patient made aware to come to office to pick up.  

## 2014-06-05 NOTE — Telephone Encounter (Signed)
Okay to refill? 

## 2014-06-05 NOTE — Telephone Encounter (Signed)
Ok to refill??  Last office visit 05/09/2014.  Last refill 05/05/2014.

## 2014-06-11 ENCOUNTER — Other Ambulatory Visit: Payer: Self-pay | Admitting: Family Medicine

## 2014-06-12 NOTE — Telephone Encounter (Signed)
Medication refilled on 05/09/2014 with #2 refills.   Call placed to pharmacy. Pharmacy states that refill was not obtained.   Medication called to pharmacy.

## 2014-07-05 ENCOUNTER — Telehealth: Payer: Self-pay | Admitting: Family Medicine

## 2014-07-05 MED ORDER — HYDROCODONE-ACETAMINOPHEN 5-325 MG PO TABS: 1.0000 | ORAL_TABLET | Freq: Three times a day (TID) | ORAL | Status: DC | PRN

## 2014-07-05 NOTE — Telephone Encounter (Signed)
Patient is calling to get refill on hydrocodone  [phone]

## 2014-07-05 NOTE — Telephone Encounter (Signed)
Ok to refill??  Last office visit 05/09/2014.  Last refill 06/05/2014.

## 2014-07-05 NOTE — Telephone Encounter (Signed)
okay

## 2014-07-05 NOTE — Telephone Encounter (Signed)
Prescription printed and patient made aware to come to office to pick up.  

## 2014-07-22 ENCOUNTER — Other Ambulatory Visit: Payer: Self-pay | Admitting: Family Medicine

## 2014-07-24 NOTE — Telephone Encounter (Signed)
Refill appropriate and filled per protocol. 

## 2014-08-04 ENCOUNTER — Telehealth: Payer: Self-pay | Admitting: Family Medicine

## 2014-08-04 MED ORDER — HYDROCODONE-ACETAMINOPHEN 5-325 MG PO TABS: 1.0000 | ORAL_TABLET | Freq: Three times a day (TID) | ORAL | Status: DC | PRN

## 2014-08-04 NOTE — Telephone Encounter (Signed)
[phone]  PT is needing a Refill on HYDROcodone-acetaminophen (NORCO) 5-325 MG per tablet

## 2014-08-04 NOTE — Telephone Encounter (Signed)
Okay 

## 2014-08-04 NOTE — Telephone Encounter (Signed)
Prescription printed and patient made aware to come to office to pick up per VM.  

## 2014-08-04 NOTE — Telephone Encounter (Signed)
Ok to refill??  Last office visit 05/09/2014.  Last refill 07/05/2014.

## 2014-08-15 ENCOUNTER — Other Ambulatory Visit: Payer: Self-pay | Admitting: Family Medicine

## 2014-09-04 ENCOUNTER — Telehealth: Payer: Self-pay | Admitting: Family Medicine

## 2014-09-04 MED ORDER — HYDROCODONE-ACETAMINOPHEN 5-325 MG PO TABS: 1.0000 | ORAL_TABLET | Freq: Three times a day (TID) | ORAL | Status: DC | PRN

## 2014-09-04 NOTE — Telephone Encounter (Signed)
Prescription printed and patient made aware to come to office to pick up.  

## 2014-09-04 NOTE — Telephone Encounter (Signed)
Okay to refill? 

## 2014-09-04 NOTE — Telephone Encounter (Signed)
Patient needs refill on hydrocodone  [phone]

## 2014-09-04 NOTE — Telephone Encounter (Signed)
Ok to refill??  Last office visit 05/09/2014.   Last refill 08/04/2014.

## 2014-09-06 ENCOUNTER — Other Ambulatory Visit: Payer: Self-pay | Admitting: Family Medicine

## 2014-09-06 NOTE — Telephone Encounter (Signed)
?   OK to Refill  - LOV 05/09/14 - Last refill 06/12/14 #90/2

## 2014-09-06 NOTE — Telephone Encounter (Signed)
Okay to refill? 

## 2014-09-12 ENCOUNTER — Encounter: Payer: Self-pay | Admitting: Family Medicine

## 2014-09-12 ENCOUNTER — Ambulatory Visit (INDEPENDENT_AMBULATORY_CARE_PROVIDER_SITE_OTHER): Payer: Medicare Other | Admitting: Family Medicine

## 2014-09-12 VITALS — BP 144/76 | HR 86 | Temp 98.3°F | Resp 14 | Ht 68.0 in | Wt 206.0 lb

## 2014-09-12 DIAGNOSIS — F329 Major depressive disorder, single episode, unspecified: Secondary | ICD-10-CM

## 2014-09-12 DIAGNOSIS — F32A Depression, unspecified: Secondary | ICD-10-CM

## 2014-09-12 DIAGNOSIS — E785 Hyperlipidemia, unspecified: Secondary | ICD-10-CM | POA: Diagnosis not present

## 2014-09-12 DIAGNOSIS — E669 Obesity, unspecified: Secondary | ICD-10-CM

## 2014-09-12 DIAGNOSIS — M19041 Primary osteoarthritis, right hand: Secondary | ICD-10-CM | POA: Diagnosis not present

## 2014-09-12 DIAGNOSIS — M170 Bilateral primary osteoarthritis of knee: Secondary | ICD-10-CM

## 2014-09-12 DIAGNOSIS — M19049 Primary osteoarthritis, unspecified hand: Secondary | ICD-10-CM | POA: Insufficient documentation

## 2014-09-12 DIAGNOSIS — I10 Essential (primary) hypertension: Secondary | ICD-10-CM

## 2014-09-12 DIAGNOSIS — M19042 Primary osteoarthritis, left hand: Secondary | ICD-10-CM | POA: Diagnosis not present

## 2014-09-12 MED ORDER — DOXEPIN HCL 25 MG PO CAPS: 25.0000 mg | ORAL_CAPSULE | Freq: Every day | ORAL | Status: DC

## 2014-09-12 MED ORDER — AMLODIPINE BESYLATE 10 MG PO TABS: 10.0000 mg | ORAL_TABLET | Freq: Every day | ORAL | Status: DC

## 2014-09-12 NOTE — Patient Instructions (Signed)
Continue current medications Work on weight loss- need to lose 5lbs F/U 6 months for PHYSICAL

## 2014-09-12 NOTE — Assessment & Plan Note (Signed)
He has been gaining weight, discussed watching his salt intake and not eating so many fried foods

## 2014-09-12 NOTE — Assessment & Plan Note (Signed)
Based on appearance OA of bilat hands, continue naprosyn , pain meds

## 2014-09-12 NOTE — Assessment & Plan Note (Signed)
Stable, continue doxepin and benzo, benefits outweight risk for age

## 2014-09-12 NOTE — Assessment & Plan Note (Signed)
No BP meds today, advised to take in AM with breakfast, for age BP still okay

## 2014-09-12 NOTE — Progress Notes (Signed)
Patient ID: [name], male   DOB: Jun 17, 1927, 81 y.o.   MRN: [id number]   Subjective:    Patient ID: [name], male    DOB: [birth year], 81 y.o.   MRN: [id number]  Patient presents for 4 month F/U  patient here to follow-up chronic medical problems. In general he states he is doing fairly well. He has problems with arthritis in his knees and now worsening in his hands. He gets pain in both thumbs when he is trying to do a lot of work. The Naprosyn does help actually better than the hydrocodone.  Anxiety/depression he is doing well with the doxepin as well as the Ativan he has no concerns with these medications.  Shortness of breath he has gained about 5 pounds since her last visit. He states for the past couple years he does get short of breath when he is doing activities but he noticed  now when he walks behind his pressure may or he gets a little winded in the beginning but this is then able to complete melena grass he does not have any problems around his home or doing other activities. He denies any chest pain and denies any cough    Review Of Systems:  GEN- denies fatigue, fever, weight loss,weakness, recent illness HEENT- denies eye drainage, change in vision, nasal discharge, CVS- denies chest pain, palpitations RESP-+ SOB, cough, wheeze ABD- denies N/V, change in stools, abd pain GU- denies dysuria, hematuria, dribbling, incontinence MSK-+ joint pain, muscle aches, injury Neuro- denies headache, dizziness, syncope, seizure activity       Objective:    BP 144/76 mmHg  Pulse 86  Temp(Src) 98.3 F (36.8 C) (Oral)  Resp 14  Ht 5\' 8"  (1.727 m)  Wt 206 lb (93.441 kg)  BMI 31.33 kg/m2 GEN- NAD, alert and oriented x3 HEENT- PERRL, EOMI, non injected sclera, pink conjunctiva, MMM, oropharynx clear Neck- Supple, no JVD CVS- RRR, no murmur RESP-CTAB ABD-NABS,soft,NT,ND Msk- bilat hands, nodules at DIP, mild s curvature, -contracted tendon 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 Any transcriptional errors that result from this process are unintentional.

## 2014-10-03 ENCOUNTER — Telehealth: Payer: Self-pay | Admitting: Family Medicine

## 2014-10-03 MED ORDER — HYDROCODONE-ACETAMINOPHEN 5-325 MG PO TABS: 1.0000 | ORAL_TABLET | Freq: Three times a day (TID) | ORAL | Status: DC | PRN

## 2014-10-03 NOTE — Telephone Encounter (Signed)
Patient needs refill on hydrocodone  [phone]

## 2014-10-03 NOTE — Telephone Encounter (Signed)
Ok to refill??  Last office visit 09/12/2014.  Last refill 09/04/2014.

## 2014-10-03 NOTE — Telephone Encounter (Signed)
okay

## 2014-10-03 NOTE — Telephone Encounter (Signed)
Prescription printed and patient made aware to come to office to pick up per VM.  

## 2014-11-02 ENCOUNTER — Telehealth: Payer: Self-pay | Admitting: Family Medicine

## 2014-11-02 NOTE — Telephone Encounter (Signed)
Ok to refill??  Last office visit 09/12/2014.  Last refill 10/03/2014.

## 2014-11-02 NOTE — Telephone Encounter (Signed)
Patient calling for refill of hydrocodone [phone]

## 2014-11-03 MED ORDER — HYDROCODONE-ACETAMINOPHEN 5-325 MG PO TABS: 1.0000 | ORAL_TABLET | Freq: Three times a day (TID) | ORAL | Status: DC | PRN

## 2014-11-03 NOTE — Telephone Encounter (Signed)
Okay to refill? 

## 2014-11-03 NOTE — Telephone Encounter (Signed)
Prescription printed and patient made aware to come to office to pick up after lunch on 11/03/2014.

## 2014-11-29 ENCOUNTER — Other Ambulatory Visit: Payer: Self-pay | Admitting: Family Medicine

## 2014-11-29 NOTE — Telephone Encounter (Signed)
Ok to refill??  Last office visit 09/12/2014.  Last refill 09/06/2014, 32 refills.

## 2014-11-29 NOTE — Telephone Encounter (Signed)
Okay to refill? 

## 2014-11-30 ENCOUNTER — Telehealth: Payer: Self-pay | Admitting: *Deleted

## 2014-11-30 ENCOUNTER — Encounter: Payer: Self-pay | Admitting: *Deleted

## 2014-11-30 MED ORDER — HYDROCODONE-ACETAMINOPHEN 5-325 MG PO TABS: 1.0000 | ORAL_TABLET | Freq: Three times a day (TID) | ORAL | Status: DC | PRN

## 2014-11-30 NOTE — Telephone Encounter (Signed)
This encounter was created in error - please disregard.

## 2014-11-30 NOTE — Telephone Encounter (Signed)
Okay to refill? 

## 2014-11-30 NOTE — Telephone Encounter (Signed)
Noted prescription for Hydrocodone due on 12/03/2014.   Ok to refill??  Last office visit 09/12/2014.   If approved please contact patient to pick up.

## 2014-11-30 NOTE — Telephone Encounter (Signed)
Medication called to pharmacy. 

## 2014-11-30 NOTE — Telephone Encounter (Signed)
Clarification: #2 refills.

## 2014-11-30 NOTE — Telephone Encounter (Signed)
Prescription printed and patient made aware to come to office to pick up on 12/01/2014 per VM.

## 2014-12-01 ENCOUNTER — Telehealth: Payer: Self-pay | Admitting: Family Medicine

## 2014-12-01 NOTE — Telephone Encounter (Signed)
Patient calling for rx for his hydrocodone  [phone] when ready said he would need by Tuesday morning

## 2014-12-01 NOTE — Telephone Encounter (Signed)
This script was printed out on 11/30/14 and signed by provider 12/01/14

## 2014-12-11 ENCOUNTER — Other Ambulatory Visit: Payer: Self-pay | Admitting: Family Medicine

## 2014-12-11 NOTE — Telephone Encounter (Signed)
Medication refilled per protocol. 

## 2014-12-28 ENCOUNTER — Telehealth: Payer: Self-pay | Admitting: *Deleted

## 2014-12-28 NOTE — Telephone Encounter (Signed)
Pt calling needing a refill on hydrocodone and stated that he will be out in a couple days  Last refill 11/30/14

## 2014-12-29 MED ORDER — HYDROCODONE-ACETAMINOPHEN 5-325 MG PO TABS: 1.0000 | ORAL_TABLET | Freq: Three times a day (TID) | ORAL | Status: DC | PRN

## 2014-12-29 NOTE — Telephone Encounter (Signed)
Med printed out, ready for provider signature

## 2014-12-29 NOTE — Telephone Encounter (Signed)
Okay to refill? 

## 2015-01-27 ENCOUNTER — Other Ambulatory Visit: Payer: Self-pay | Admitting: Family Medicine

## 2015-01-29 ENCOUNTER — Telehealth: Payer: Self-pay | Admitting: Family Medicine

## 2015-01-29 MED ORDER — HYDROCODONE-ACETAMINOPHEN 5-325 MG PO TABS: 1.0000 | ORAL_TABLET | Freq: Three times a day (TID) | ORAL | Status: DC | PRN

## 2015-01-29 NOTE — Telephone Encounter (Signed)
Ok to refill??  Last office visit 09/12/2014.  Last refill 12/29/2014.

## 2015-01-29 NOTE — Telephone Encounter (Signed)
Prescription printed and patient made aware to come to office to pick up on 01/30/2015.

## 2015-01-29 NOTE — Telephone Encounter (Signed)
Okay to refill? 

## 2015-01-29 NOTE — Telephone Encounter (Signed)
Requesting rx for his hydrocodone  [phone]

## 2015-01-29 NOTE — Telephone Encounter (Signed)
Ok to refill??  Last office visit 09/12/2014.  Last refill 11/30/2014, #2 refills.

## 2015-01-29 NOTE — Telephone Encounter (Signed)
Medication called to pharmacy. 

## 2015-03-02 ENCOUNTER — Telehealth: Payer: Self-pay | Admitting: Family Medicine

## 2015-03-02 MED ORDER — HYDROCODONE-ACETAMINOPHEN 5-325 MG PO TABS: 1.0000 | ORAL_TABLET | Freq: Three times a day (TID) | ORAL | Status: DC | PRN

## 2015-03-02 NOTE — Telephone Encounter (Signed)
Ok to refill??  Last office visit 09/12/2014.  Last refill 01/29/2015.

## 2015-03-02 NOTE — Telephone Encounter (Signed)
Prescription printed and patient made aware to come to office to pick up after 2pm on 03/02/2015. 

## 2015-03-02 NOTE — Telephone Encounter (Signed)
Pt is calling for a refill of hydrocodone.  [phone]

## 2015-03-02 NOTE — Telephone Encounter (Signed)
Okay to refill? 

## 2015-03-16 ENCOUNTER — Ambulatory Visit (INDEPENDENT_AMBULATORY_CARE_PROVIDER_SITE_OTHER): Payer: Medicare Other | Admitting: Family Medicine

## 2015-03-16 ENCOUNTER — Encounter: Payer: Self-pay | Admitting: Family Medicine

## 2015-03-16 VITALS — BP 144/82 | HR 68 | Temp 98.4°F | Resp 18 | Ht 68.0 in | Wt 204.0 lb

## 2015-03-16 DIAGNOSIS — F32A Depression, unspecified: Secondary | ICD-10-CM

## 2015-03-16 DIAGNOSIS — Z23 Encounter for immunization: Secondary | ICD-10-CM

## 2015-03-16 DIAGNOSIS — D229 Melanocytic nevi, unspecified: Secondary | ICD-10-CM | POA: Diagnosis not present

## 2015-03-16 DIAGNOSIS — F419 Anxiety disorder, unspecified: Secondary | ICD-10-CM | POA: Diagnosis not present

## 2015-03-16 DIAGNOSIS — H6123 Impacted cerumen, bilateral: Secondary | ICD-10-CM

## 2015-03-16 DIAGNOSIS — M170 Bilateral primary osteoarthritis of knee: Secondary | ICD-10-CM | POA: Diagnosis not present

## 2015-03-16 DIAGNOSIS — F329 Major depressive disorder, single episode, unspecified: Secondary | ICD-10-CM | POA: Diagnosis not present

## 2015-03-16 DIAGNOSIS — E785 Hyperlipidemia, unspecified: Secondary | ICD-10-CM | POA: Diagnosis not present

## 2015-03-16 DIAGNOSIS — I10 Essential (primary) hypertension: Secondary | ICD-10-CM

## 2015-03-16 DIAGNOSIS — Z0000 Encounter for general adult medical examination without abnormal findings: Secondary | ICD-10-CM | POA: Diagnosis not present

## 2015-03-16 LAB — CBC WITH DIFFERENTIAL/PLATELET
Basophils Absolute: 0.1 10*3/uL (ref 0.0–0.1)
Basophils Relative: 1 % (ref 0–1)
Eosinophils Absolute: 0.2 10*3/uL (ref 0.0–0.7)
Eosinophils Relative: 3 % (ref 0–5)
HEMATOCRIT: 41.9 % (ref 39.0–52.0)
HEMOGLOBIN: 13.6 g/dL (ref 13.0–17.0)
LYMPHS PCT: 19 % (ref 12–46)
Lymphs Abs: 1.2 10*3/uL (ref 0.7–4.0)
MCH: 28.7 pg (ref 26.0–34.0)
MCHC: 32.5 g/dL (ref 30.0–36.0)
MCV: 88.4 fL (ref 78.0–100.0)
MONO ABS: 0.7 10*3/uL (ref 0.1–1.0)
MPV: 9.6 fL (ref 8.6–12.4)
Monocytes Relative: 12 % (ref 3–12)
NEUTROS ABS: 4 10*3/uL (ref 1.7–7.7)
Neutrophils Relative %: 65 % (ref 43–77)
Platelets: 250 10*3/uL (ref 150–400)
RBC: 4.74 MIL/uL (ref 4.22–5.81)
RDW: 15 % (ref 11.5–15.5)
WBC: 6.2 10*3/uL (ref 4.0–10.5)

## 2015-03-16 LAB — COMPREHENSIVE METABOLIC PANEL
ALBUMIN: 4.3 g/dL (ref 3.6–5.1)
ALK PHOS: 70 U/L (ref 40–115)
ALT: 15 U/L (ref 9–46)
AST: 18 U/L (ref 10–35)
BILIRUBIN TOTAL: 0.8 mg/dL (ref 0.2–1.2)
BUN: 18 mg/dL (ref 7–25)
CALCIUM: 9.3 mg/dL (ref 8.6–10.3)
CO2: 25 mmol/L (ref 20–31)
Chloride: 103 mmol/L (ref 98–110)
Creat: 0.92 mg/dL (ref 0.70–1.11)
Glucose, Bld: 95 mg/dL (ref 70–99)
Potassium: 4.2 mmol/L (ref 3.5–5.3)
Sodium: 138 mmol/L (ref 135–146)
TOTAL PROTEIN: 7 g/dL (ref 6.1–8.1)

## 2015-03-16 LAB — LIPID PANEL
CHOLESTEROL: 191 mg/dL (ref 125–200)
HDL: 38 mg/dL — AB (ref >=40)
LDL Cholesterol: 120 mg/dL (ref <130)
TRIGLYCERIDES: 163 mg/dL — AB (ref <150)
Total CHOL/HDL Ratio: 5 Ratio (ref <=5.0)
VLDL: 33 mg/dL — ABNORMAL HIGH (ref <30)

## 2015-03-16 MED ORDER — DOXEPIN HCL 25 MG PO CAPS: 25.0000 mg | ORAL_CAPSULE | Freq: Every day | ORAL | Status: DC

## 2015-03-16 MED ORDER — OMEPRAZOLE 20 MG PO CPDR: 20.0000 mg | DELAYED_RELEASE_CAPSULE | Freq: Every day | ORAL | Status: DC

## 2015-03-16 MED ORDER — LORAZEPAM 1 MG PO TABS: 1.0000 mg | ORAL_TABLET | Freq: Three times a day (TID) | ORAL | Status: DC

## 2015-03-16 MED ORDER — NAPROXEN 375 MG PO TABS: ORAL_TABLET | ORAL | Status: DC

## 2015-03-16 MED ORDER — AMLODIPINE BESYLATE 10 MG PO TABS: 10.0000 mg | ORAL_TABLET | Freq: Every day | ORAL | Status: DC

## 2015-03-16 MED ORDER — HYDROCODONE-ACETAMINOPHEN 5-325 MG PO TABS: 1.0000 | ORAL_TABLET | Freq: Three times a day (TID) | ORAL | Status: DC | PRN

## 2015-03-16 NOTE — Assessment & Plan Note (Signed)
Referral to ortho, continue norco, given script for Oct Naprosyn bid AS WELL

## 2015-03-16 NOTE — Patient Instructions (Signed)
Flu shot  Sarna LOTION for  YOUR BACK  Referral to [place] Take Doxepin at night Given prescription for pain medication for October  We will call with lab results F/U 4 months

## 2015-03-16 NOTE — Progress Notes (Signed)
Patient ID: [name], male   DOB: 07/12/1927, 81 y.o.   MRN: [id number] Subjective:   Patient presents for Medicare Annual/Subsequent preventive examination.  Patient here for a wellness exam. 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 On review of medication he has not been taking his doxepin did not realize he did not have the medication which was helping him sleeping with his anxiety. He is taking his Ativan 3 times a day and noticed when he woke up in the middle the night he feels like he would need another Ativan and therefore was running out early.  He also requests referral to see orthopedics again for his knees he has known osteoarthritis he would like to see a new one for second opinion. So far he has been managed with arthroscopic and now chronic pain medication.  Itches all over his back for months now, followed with dermatology   Needs ears cleaned out  Review Past Medical/Family/Social: per EMR    Risk Factors  Current exercise habits: walks  Dietary issues discussed:yes   Cardiac risk factors: Obesity (BMI >= 30 kg/m2). HTN  Depression Screen  (Note: if answer to either of the following is "Yes", a more complete depression screening is indicated)  Over the past two weeks, have you felt down, depressed or hopeless? No Over the past two weeks, have you felt little interest or pleasure in doing things? No Have you lost interest or pleasure in daily life? No Do you often feel hopeless? No Do you cry easily over simple problems? No   Activities of Daily Living  In your present state of health, do you have any difficulty performing the following activities?:  Driving? No  Managing money? No  Feeding yourself? No  Getting from bed to chair? No  Climbing a flight of stairs? No  Preparing food and eating?: No  Bathing or showering? No  Getting dressed: No  Getting to the toilet? No  Using the toilet:No  Moving around from  place to place: No  In the past year have you fallen or had a near fall?:No  Are you sexually active? yes Do you have more than one partner? No   Hearing Difficulties: No  Do you often ask people to speak up or repeat themselves? yes  Do you experience ringing or noises in your ears? No Do you have difficulty understanding soft or whispered voices? yes  Do you feel that you have a problem with memory? No Do you often misplace items? yes  Do you feel safe at home? Yes  Cognitive Testing  Alert? Yes Normal Appearance?Yes  Oriented to person? Yes Place? Yes  Time? Yes  Recall of three objects? Yes  Can perform simple calculations? Yes  Displays appropriate judgment?Yes  Can read the correct time from a watch face?Yes   List the Names of Other Physician/Practitioners you currently use: Dr. [place] Crane    Screening Tests / Date Colonoscopy    - not needed                 Influenza Vaccine - Due  Tetanus/tdap - pt declined   ROS: Per above  GEN- denies fatigue, fever, weight loss,weakness, recent illness HEENT- denies eye drainage, change in vision, nasal discharge, CVS- denies chest pain, palpitations RESP- denies SOB, cough, wheeze ABD- denies N/V, change in stools, abd pain GU- denies dysuria, hematuria, dribbling, incontinence MSK- + joint pain, muscle aches, injury  Neuro- denies headache, dizziness, syncope, seizure activity  Physical: GEN- NAD, alert and oriented x3 HEENT- PERRL, EOMI, non injected sclera, pink conjunctiva, MMM, oropharynx clear, Bilat ear TM- canal impacted  Neck- Supple, no thryomegaly CVS- RRR, no murmur RESP-CTAB MSK- Fair ROM bilat knees  Psych- normal affect and mood  EXT- No edema Pulses- Radial, DP- 2+ Skin- multiple moles and Sebeorrheic keratotis,  Mild erythema, no discrete rash     Assessment:    Annual wellness medicare exam   Plan:    During the course of the visit the patient was educated and counseled about appropriate screening  and preventive services including:  Flu shot given - Ear irrigation done at bedside   Screen + for depression. PHQ- 6 score of 12 (moderate depression). Diet review for nutrition referral? Yes ____ Not Indicated __x__  Patient Instructions (the written plan) was given to the patient.  Medicare Attestation  I have personally reviewed:  The patient's medical and social history  Their use of alcohol, tobacco or illicit drugs  Their current medications and supplements  The patient's functional ability including ADLs,fall risks, home safety risks, cognitive, and hearing and visual impairment  Diet and physical activities  Evidence for depression or mood disorders  The patient's weight, height, BMI, and visual acuity have been recorded in the chart. I have made referrals, counseling, and provided education to the patient based on review of the above and I have provided the patient with a written personalized care plan for preventive services.

## 2015-03-16 NOTE — Assessment & Plan Note (Signed)
RESTART doxepin, continue ativan

## 2015-03-16 NOTE — Assessment & Plan Note (Signed)
Multiple lesions on back,benign appearing F/u with derm Sarna Lotion for ithcy dry skin

## 2015-03-16 NOTE — Assessment & Plan Note (Signed)
WELL CONTROLLED 

## 2015-03-16 NOTE — Addendum Note (Signed)
Addended by: Sheral Flow on: 03/16/2015 10:21 AM   Modules accepted: Orders

## 2015-03-16 NOTE — Assessment & Plan Note (Addendum)
CPE Done, fasting labs

## 2015-03-19 ENCOUNTER — Encounter: Payer: Self-pay | Admitting: *Deleted

## 2015-04-24 ENCOUNTER — Telehealth: Payer: Self-pay | Admitting: Family Medicine

## 2015-04-24 MED ORDER — HYDROCODONE-ACETAMINOPHEN 5-325 MG PO TABS: 1.0000 | ORAL_TABLET | Freq: Three times a day (TID) | ORAL | Status: DC | PRN

## 2015-04-24 NOTE — Telephone Encounter (Signed)
Prescription printed and patient made aware to come to office to pick up after 4 pm on 04/24/2015.

## 2015-04-24 NOTE — Telephone Encounter (Signed)
Ok to refill??  Last office visit/ refill 03/16/2015.

## 2015-04-24 NOTE — Telephone Encounter (Signed)
Okay 

## 2015-04-24 NOTE — Telephone Encounter (Signed)
Patient calling to get rx for his hydrocodone  [phone]

## 2015-05-22 ENCOUNTER — Telehealth: Payer: Self-pay | Admitting: Family Medicine

## 2015-05-22 MED ORDER — HYDROCODONE-ACETAMINOPHEN 5-325 MG PO TABS: 1.0000 | ORAL_TABLET | Freq: Three times a day (TID) | ORAL | Status: DC | PRN

## 2015-05-22 NOTE — Telephone Encounter (Signed)
Okay to refill? 

## 2015-05-22 NOTE — Telephone Encounter (Signed)
Pt is calling for a refill of Hydrocodone. [phone]

## 2015-05-22 NOTE — Telephone Encounter (Signed)
Ok to refill??  Last office visit 03/16/2015.  Last refill 04/24/2015.

## 2015-05-22 NOTE — Telephone Encounter (Signed)
Prescription printed and patient made aware to come to office to pick up after 2pm on 05/22/2015.

## 2015-06-15 ENCOUNTER — Other Ambulatory Visit: Payer: Self-pay | Admitting: Family Medicine

## 2015-06-15 MED ORDER — HYDROCODONE-ACETAMINOPHEN 5-325 MG PO TABS: 1.0000 | ORAL_TABLET | Freq: Three times a day (TID) | ORAL | Status: DC | PRN

## 2015-06-15 NOTE — Telephone Encounter (Signed)
LRF 05/22/15 #80  LOV 03/16/15  OK refill?

## 2015-06-15 NOTE — Telephone Encounter (Signed)
Rx ready and pt aware 

## 2015-06-15 NOTE — Telephone Encounter (Signed)
Patient is calling to get rx for hydrocodone would like to pick up Monday if possible  [phone] (H)

## 2015-06-15 NOTE — Telephone Encounter (Signed)
Okay 

## 2015-07-18 ENCOUNTER — Other Ambulatory Visit: Payer: Self-pay | Admitting: Family Medicine

## 2015-07-18 ENCOUNTER — Telehealth: Payer: Self-pay | Admitting: Family Medicine

## 2015-07-18 MED ORDER — HYDROCODONE-ACETAMINOPHEN 5-325 MG PO TABS: 1.0000 | ORAL_TABLET | Freq: Three times a day (TID) | ORAL | Status: DC | PRN

## 2015-07-18 NOTE — Telephone Encounter (Signed)
Okay to refill? 

## 2015-07-18 NOTE — Telephone Encounter (Signed)
Refill request for HYDROcodone-acetaminophen ([place]) 5-325 MG  Would like to pick up either today or tomorrow.    [phone]

## 2015-07-18 NOTE — Telephone Encounter (Signed)
Medication called to pharmacy. 

## 2015-07-18 NOTE — Telephone Encounter (Signed)
Ok to refill??  Last office visit/ refill 03/16/2015, #2 refills.

## 2015-07-18 NOTE — Telephone Encounter (Signed)
Refused request.   Refill dealt with in another message.

## 2015-07-18 NOTE — Telephone Encounter (Signed)
Ok to refill??  Last office visit 03/16/2015.   Last refill 06/15/2015.

## 2015-07-18 NOTE — Telephone Encounter (Signed)
Prescription printed and patient made aware to come to office to pick up after 3pm on 07/18/2015 per VM.

## 2015-08-14 ENCOUNTER — Telehealth: Payer: Self-pay | Admitting: Family Medicine

## 2015-08-14 MED ORDER — HYDROCODONE-ACETAMINOPHEN 5-325 MG PO TABS: 1.0000 | ORAL_TABLET | Freq: Three times a day (TID) | ORAL | Status: DC | PRN

## 2015-08-14 NOTE — Telephone Encounter (Signed)
Ok to refill??  Last office visit 03/16/2015.  Last refill 07/18/2015.

## 2015-08-14 NOTE — Telephone Encounter (Signed)
Prescription printed and patient made aware to come to office to pick up after 2pm on 08/14/2015.

## 2015-08-14 NOTE — Telephone Encounter (Signed)
Okay to refill? 

## 2015-08-14 NOTE — Telephone Encounter (Signed)
Patient is asking for rx for her hydrocodone  [phone]

## 2015-08-28 DIAGNOSIS — L570 Actinic keratosis: Secondary | ICD-10-CM | POA: Diagnosis not present

## 2015-08-28 DIAGNOSIS — X32XXXD Exposure to sunlight, subsequent encounter: Secondary | ICD-10-CM | POA: Diagnosis not present

## 2015-08-28 DIAGNOSIS — L820 Inflamed seborrheic keratosis: Secondary | ICD-10-CM | POA: Diagnosis not present

## 2015-09-10 ENCOUNTER — Encounter: Payer: Self-pay | Admitting: Family Medicine

## 2015-09-10 ENCOUNTER — Ambulatory Visit (INDEPENDENT_AMBULATORY_CARE_PROVIDER_SITE_OTHER): Payer: Medicare Other | Admitting: Family Medicine

## 2015-09-10 VITALS — BP 142/80 | HR 88 | Temp 98.1°F | Resp 16 | Ht 68.0 in | Wt 197.0 lb

## 2015-09-10 DIAGNOSIS — J209 Acute bronchitis, unspecified: Secondary | ICD-10-CM

## 2015-09-10 DIAGNOSIS — I10 Essential (primary) hypertension: Secondary | ICD-10-CM | POA: Diagnosis not present

## 2015-09-10 DIAGNOSIS — G4700 Insomnia, unspecified: Secondary | ICD-10-CM

## 2015-09-10 DIAGNOSIS — F419 Anxiety disorder, unspecified: Secondary | ICD-10-CM | POA: Diagnosis not present

## 2015-09-10 MED ORDER — LORAZEPAM 1 MG PO TABS: ORAL_TABLET | ORAL | Status: DC

## 2015-09-10 MED ORDER — HYDROCODONE-ACETAMINOPHEN 5-325 MG PO TABS: 1.0000 | ORAL_TABLET | Freq: Three times a day (TID) | ORAL | Status: DC | PRN

## 2015-09-10 MED ORDER — BENZONATATE 100 MG PO CAPS: 100.0000 mg | ORAL_CAPSULE | Freq: Three times a day (TID) | ORAL | Status: DC | PRN

## 2015-09-10 MED ORDER — DOXYCYCLINE HYCLATE 100 MG PO TABS: 100.0000 mg | ORAL_TABLET | Freq: Two times a day (BID) | ORAL | Status: DC

## 2015-09-10 NOTE — Patient Instructions (Signed)
Continue current medications Take antibiotics Take cough pill Tessalon F/U 4 months

## 2015-09-11 ENCOUNTER — Encounter: Payer: Self-pay | Admitting: Family Medicine

## 2015-09-11 NOTE — Progress Notes (Signed)
Patient ID: [name], male   DOB: July 29, 1927, 82 y.o.   MRN: [id number]    Subjective:    Patient ID: [name], male    DOB: 07-Jun-1927, 82 y.o.   MRN: [id number]  Patient presents for 4 month F/U  Patient to follow-up chronic medical problems. He complains of chest congestion with production for the past 10 days. He was eaten some over-the-counter medicines that she'll fourth and with minimal improvement he also states that it causes him some diarrhea. He has not had any recent fever no difficulty breathing or chest pain.  He is still taking his other medications as prescribed uses doxepin for sleep and still uses Ativan that he feels like the Ativan does wear off some.   Review Of Systems:  GEN- denies fatigue, fever, weight loss,weakness, recent illness HEENT- denies eye drainage, change in vision,+ nasal discharge, CVS- denies chest pain, palpitations RESP- denies SOB, +cough, wheeze ABD- denies N/V, change in stools, abd pain GU- denies dysuria, hematuria, dribbling, incontinence MSK- + joint pain, muscle aches, injury Neuro- denies headache, dizziness, syncope, seizure activity       Objective:    BP 142/80 mmHg  Pulse 88  Temp(Src) 98.1 F (36.7 C) (Oral)  Resp 16  Ht 5\' 8"  (1.727 m)  Wt 197 lb (89.359 kg)  BMI 29.96 kg/m2 GEN- NAD, alert and oriented x3 HEENT- PERRL, EOMI, non injected sclera, pink conjunctiva, MMM, oropharynx clear Neck- Supple, no LAD  CVS- RRR, no murmur RESP- congestion bilat, no wheeze, normal WOB, no retractions  ABD-NABS,soft,NT,ND Psych- very pleasant, normal affect and mood EXT- No edema Pulses- Radial- 2+        Assessment & Plan:      Problem List Items Addressed This Visit    Insomnia    Continue doxepin at bedtime      Hypertension    Blood pressure is controlled for age no change in medication      Anxiety    I'll continue the Ativan and the doxepin as needed for sleep. He has been on for quite some  time and does not want to taper off.      Relevant Medications   LORazepam (ATIVAN) 1 MG tablet    Other Visit Diagnoses    Acute bronchitis, unspecified organism    -  Primary    Treatment for bronchitis given doxycycline given secondary to interactions with other antibiotics with his anxiety meds. Tessalon Perles       Note: This dictation was prepared with Dragon dictation along with smaller Company secretary. Any transcriptional errors that result from this process are unintentional.

## 2015-09-11 NOTE — Assessment & Plan Note (Signed)
Continue doxepin at bedtime

## 2015-09-11 NOTE — Assessment & Plan Note (Signed)
I'll continue the Ativan and the doxepin as needed for sleep. He has been on for quite some time and does not want to taper off.

## 2015-09-11 NOTE — Assessment & Plan Note (Signed)
Blood pressure is controlled for age no change in medication

## 2015-09-19 ENCOUNTER — Other Ambulatory Visit: Payer: Self-pay | Admitting: Family Medicine

## 2015-09-19 NOTE — Telephone Encounter (Signed)
Refill appropriate and filled per protocol. 

## 2015-10-01 DIAGNOSIS — L820 Inflamed seborrheic keratosis: Secondary | ICD-10-CM | POA: Diagnosis not present

## 2015-10-05 ENCOUNTER — Other Ambulatory Visit: Payer: Self-pay | Admitting: Family Medicine

## 2015-10-05 ENCOUNTER — Telehealth: Payer: Self-pay | Admitting: Family Medicine

## 2015-10-05 MED ORDER — HYDROCODONE-ACETAMINOPHEN 5-325 MG PO TABS: 1.0000 | ORAL_TABLET | Freq: Three times a day (TID) | ORAL | Status: DC | PRN

## 2015-10-05 NOTE — Telephone Encounter (Signed)
Prescription printed and patient made aware to come to office to pick up after 2 pm on 10/05/2015 per VM.

## 2015-10-05 NOTE — Telephone Encounter (Signed)
Ok to refill??  Last office visit/ refill 09/10/2015.

## 2015-10-05 NOTE — Telephone Encounter (Signed)
Okay to refill? 

## 2015-10-05 NOTE — Telephone Encounter (Signed)
Pt is requesting a refill of Hydrocodone 5-325 [phone]

## 2015-10-31 ENCOUNTER — Encounter (HOSPITAL_COMMUNITY): Payer: Self-pay

## 2015-10-31 ENCOUNTER — Emergency Department (HOSPITAL_COMMUNITY)
Admission: EM | Admit: 2015-10-31 | Discharge: 2015-10-31 | Disposition: A | Discharge status: Home / Self Care | Payer: Medicare Other | Attending: Emergency Medicine | Admitting: Emergency Medicine

## 2015-10-31 DIAGNOSIS — F1320 Sedative, hypnotic or anxiolytic dependence, uncomplicated: Secondary | ICD-10-CM | POA: Diagnosis not present

## 2015-10-31 DIAGNOSIS — R443 Hallucinations, unspecified: Secondary | ICD-10-CM | POA: Insufficient documentation

## 2015-10-31 DIAGNOSIS — E785 Hyperlipidemia, unspecified: Secondary | ICD-10-CM | POA: Insufficient documentation

## 2015-10-31 DIAGNOSIS — M1990 Unspecified osteoarthritis, unspecified site: Secondary | ICD-10-CM | POA: Insufficient documentation

## 2015-10-31 DIAGNOSIS — I10 Essential (primary) hypertension: Secondary | ICD-10-CM | POA: Insufficient documentation

## 2015-10-31 DIAGNOSIS — F329 Major depressive disorder, single episode, unspecified: Secondary | ICD-10-CM | POA: Insufficient documentation

## 2015-10-31 DIAGNOSIS — F13239 Sedative, hypnotic or anxiolytic dependence with withdrawal, unspecified: Secondary | ICD-10-CM

## 2015-10-31 DIAGNOSIS — F13939 Sedative, hypnotic or anxiolytic use, unspecified with withdrawal, unspecified: Secondary | ICD-10-CM

## 2015-10-31 DIAGNOSIS — Z7982 Long term (current) use of aspirin: Secondary | ICD-10-CM | POA: Diagnosis not present

## 2015-10-31 LAB — CBC WITH DIFFERENTIAL/PLATELET
BASOS ABS: 0 10*3/uL (ref 0.0–0.1)
BASOS PCT: 0 %
Eosinophils Absolute: 0 10*3/uL (ref 0.0–0.7)
Eosinophils Relative: 0 %
HEMATOCRIT: 41.7 % (ref 39.0–52.0)
HEMOGLOBIN: 13.9 g/dL (ref 13.0–17.0)
Lymphocytes Relative: 7 %
Lymphs Abs: 1 10*3/uL (ref 0.7–4.0)
MCH: 29.7 pg (ref 26.0–34.0)
MCHC: 33.3 g/dL (ref 30.0–36.0)
MCV: 89.1 fL (ref 78.0–100.0)
MONOS PCT: 7 %
Monocytes Absolute: 1 10*3/uL (ref 0.1–1.0)
NEUTROS ABS: 12 10*3/uL — AB (ref 1.7–7.7)
NEUTROS PCT: 86 %
Platelets: 209 10*3/uL (ref 150–400)
RBC: 4.68 MIL/uL (ref 4.22–5.81)
RDW: 14.5 % (ref 11.5–15.5)
WBC: 14 10*3/uL — ABNORMAL HIGH (ref 4.0–10.5)

## 2015-10-31 LAB — RAPID URINE DRUG SCREEN, HOSP PERFORMED
Amphetamines: NOT DETECTED
BARBITURATES: NOT DETECTED
Benzodiazepines: NOT DETECTED
Cocaine: NOT DETECTED
Opiates: NOT DETECTED
TETRAHYDROCANNABINOL: NOT DETECTED

## 2015-10-31 LAB — COMPREHENSIVE METABOLIC PANEL
ALBUMIN: 4.3 g/dL (ref 3.5–5.0)
ALK PHOS: 88 U/L (ref 38–126)
ALT: 38 U/L (ref 17–63)
AST: 49 U/L — AB (ref 15–41)
Anion gap: 17 — ABNORMAL HIGH (ref 5–15)
BILIRUBIN TOTAL: 2.2 mg/dL — AB (ref 0.3–1.2)
BUN: 28 mg/dL — AB (ref 6–20)
CALCIUM: 8.9 mg/dL (ref 8.9–10.3)
CO2: 20 mmol/L — ABNORMAL LOW (ref 22–32)
Chloride: 100 mmol/L — ABNORMAL LOW (ref 101–111)
Creatinine, Ser: 1.24 mg/dL (ref 0.61–1.24)
GFR calc Af Amer: 59 mL/min — ABNORMAL LOW (ref >60)
GFR calc non Af Amer: 51 mL/min — ABNORMAL LOW (ref >60)
GLUCOSE: 105 mg/dL — AB (ref 65–99)
Potassium: 3.1 mmol/L — ABNORMAL LOW (ref 3.5–5.1)
Sodium: 137 mmol/L (ref 135–145)
TOTAL PROTEIN: 7.9 g/dL (ref 6.5–8.1)

## 2015-10-31 LAB — ETHANOL: Alcohol, Ethyl (B): <5 mg/dL (ref <5)

## 2015-10-31 LAB — MAGNESIUM: MAGNESIUM: 2.1 mg/dL (ref 1.7–2.4)

## 2015-10-31 MED ORDER — SODIUM CHLORIDE 0.9 % IV SOLN
Freq: Once | INTRAVENOUS | Status: AC
  Administered 2015-10-31: 17:00:00 via INTRAVENOUS

## 2015-10-31 MED ORDER — LORAZEPAM 1 MG PO TABS
2.0000 mg | ORAL_TABLET | Freq: Once | ORAL | Status: AC
  Administered 2015-10-31: 2 mg via ORAL
  Filled 2015-10-31: qty 2

## 2015-10-31 MED ORDER — LORAZEPAM 2 MG/ML IJ SOLN
1.0000 mg | Freq: Once | INTRAMUSCULAR | Status: AC
  Administered 2015-10-31: 1 mg via INTRAVENOUS
  Filled 2015-10-31: qty 1

## 2015-10-31 MED ORDER — ADULT MULTIVITAMIN W/MINERALS CH
1.0000 | ORAL_TABLET | Freq: Once | ORAL | Status: AC
  Administered 2015-10-31: 1 via ORAL
  Filled 2015-10-31: qty 1

## 2015-10-31 MED ORDER — SODIUM CHLORIDE 0.9 % IV BOLUS (SEPSIS)
1000.0000 mL | Freq: Once | INTRAVENOUS | Status: AC
  Administered 2015-10-31: 1000 mL via INTRAVENOUS

## 2015-10-31 MED ORDER — LORAZEPAM 1 MG PO TABS: 1.0000 mg | ORAL_TABLET | Freq: Three times a day (TID) | ORAL | Status: DC | PRN

## 2015-10-31 MED ORDER — LORAZEPAM 1 MG PO TABS
1.0000 mg | ORAL_TABLET | Freq: Once | ORAL | Status: AC
  Administered 2015-10-31: 1 mg via ORAL
  Filled 2015-10-31: qty 1

## 2015-10-31 NOTE — ED Notes (Signed)
MD at bedside. 

## 2015-10-31 NOTE — ED Notes (Signed)
Meal given

## 2015-10-31 NOTE — Discharge Instructions (Signed)
You have been given a prescription for 7 days of your medication. Call Dr. Buelah Manis for an appointment as soon as possible to continue your medication.   Benzodiazepine Withdrawal  Benzodiazepines are a group of drugs that are prescribed for both short-term and long-term treatment of a variety of medical conditions. For some of these conditions, such as seizures and sudden and severe muscle spasms, they are used only for a few hours or a few days. For other conditions, such as anxiety, sleep problems, or frequent muscle spasms or to help prevent seizures, they are used for an extended period, usually weeks or months. Benzodiazepines work by changing the way your brain functions. Normally, chemicals in your brain called neurotransmitters send messages between your brain cells. The neurotransmitter that benzodiazepines affect is called gamma-aminobutyric acid (GABA). GABA sends out messages that have a calming effect on many of the functions of your brain. Benzodiazepines make these messages stronger and increase this calming effect. Short-term use of benzodiazepines usually does not cause problems when you stop taking the drugs. However, if you take benzodiazepines for a long time, your body can adjust to the drug and require more of it to produce the same effect (drug tolerance). Eventually, you can develop physical dependence on benzodiazepines, which is when you experience negative effects if your dosage of benzodiazepines is reduced or stopped too quickly. These negative effects are called symptoms of withdrawal. SYMPTOMS Symptoms of withdrawal may begin anytime within the first 10 days after you stop taking the benzodiazepine. They can last from several weeks up to a few months but usually are the worst between the first 10 to 14 days.  The actual symptoms also vary, depending on the type of benzodiazepine you take. Possible symptoms  include:  Anxiety.  Excitability.  Irritability.  Depression.  Mood swings.  Trouble sleeping.  Confusion.  Uncontrollable shaking (tremors).  Muscle weakness.  Seizures. DIAGNOSIS To diagnose benzodiazepine withdrawal, your caregiver will examine you for certain signs, such as:  Rapid heartbeat.  Rapid breathing.  Tremors.  High blood pressure.  Fever.  Mood changes. Your caregiver also may ask the following questions about your use of benzodiazepines:  What type of benzodiazepine did you take?  How much did you take each day?  How long did you take the drug?  When was the last time you took the drug?  Do you take any other drugs?  Have you had alcohol recently?  Have you had a seizure recently?  Have you lost consciousness recently?  Have you had trouble remembering recent events?  Have you had a recent increase in anxiety, irritability, or trouble sleeping? A drug test also may be administered. TREATMENT The treatment for benzodiazepine withdrawal can vary, depending on the type and severity of your symptoms, what type of benzodiazepine you have been taking, and how long you have been taking the benzodiazepine. Sometimes it is necessary for you to be treated in a hospital, especially if you are at risk of seizures.  Often, treatment includes a prescription for a long-acting benzodiazepine, the dosage of which is reduced slowly over a long period. This period could be several weeks or months. Eventually, your dosage will be reduced to a point that you can stop taking the drug, without experiencing withdrawal symptoms. This is called tapered withdrawal. Occasionally, minor symptoms of withdrawal continue for a few days or weeks after you have completed a tapered withdrawal. SEEK IMMEDIATE MEDICAL CARE IF:  You have a seizure.  You develop a  craving for drugs or alcohol.  You begin to experience symptoms of withdrawal during your tapered  withdrawal.  You become very confused.  You lose consciousness.  You have trouble breathing.  You think about hurting yourself or someone else.   This information is not intended to replace advice given to you by your health care provider. Make sure you discuss any questions you have with your health care provider.   Document Released: 05/08/2011 Document Revised: 06/09/2014 Document Reviewed: 11/08/2014 Elsevier Interactive Patient Education [employer].

## 2015-10-31 NOTE — ED Notes (Signed)
Pt made aware to return if symptoms worsen or if any life threatening symptoms occur.  Dr. Jeneen Rinks made aware of hr 125, okay to send home.

## 2015-10-31 NOTE — ED Notes (Signed)
This nurse also called  [facility] PHONE [phone] EMERGENCY CONTACT RELATION [facility] several times with no answer, only answering machine. Message left to call this nurse at [facility] at [phone].

## 2015-10-31 NOTE — ED Notes (Signed)
This nurse made multiple calls to pt house number to inquire about someone to come and transport - no answer.

## 2015-10-31 NOTE — ED Provider Notes (Signed)
CSN: [id number]     Arrival date & time 10/31/15  1221 History   First MD Initiated Contact with Patient 10/31/15 1237     Chief Complaint  Patient presents with  . Withdrawal      HPI  Patient presents for evaluation of visual hallucinations and a concern for withdrawal.  Per the patient, and per chart review patient receives Ativan 3 times daily, and hydrocodone twice per day from his primary care physician.  He also states that he used to drink heavily. He states that he drank for about a week and up until 5 days ago before stopping. States for the last 3 days he's had some mild shakes. He states that he was seeing some things at home he realized or hallucinations. He had seen what he thought was a car in his yard and goes away when he goes out in the ER. I also thought there were "[place] helicopters landing in a field". He states he spoke with his son and is aware that these are hallucinations. This is happened to him in the past. He's had somewhat drop his never had withdrawal seizures. He presents here. He feels slightly shaky. He has not drank for 3 days.  Past Medical History  Diagnosis Date  . Knee pain   . Dizzy   . Panic attacks   . Acid reflux   . Alcoholism /alcohol abuse ([place])   . Anemia   . Barrett's esophagus   . Hyperlipidemia   . Hearing impairment   . MRSA (methicillin resistant staph aureus) culture positive     Sepsis of right knee  . H/O: UGI bleed   . Colon polyp   . Thrombocytopenia ([facility])     Mild  . Depression   . Arthritis     OA  . Hypertension    Past Surgical History  Procedure Laterality Date  . Appendectomy    . Tonsillectomy    . Bilateral cataract surgery    . Knee surgery  4.2011    arthroscopic lavage, RIGHT knee for septic arthritis  . Cardiac catheterization     No family history on file. Social History  Substance Use Topics  . Smoking status: Former Research scientist (life sciences)  . Smokeless tobacco: Never Used  . Alcohol Use: Yes     Comment:  occasional with binge drinking    Review of Systems  Constitutional: Negative for fever, chills, diaphoresis, appetite change and fatigue.  HENT: Negative for mouth sores, sore throat and trouble swallowing.   Eyes: Negative for visual disturbance.  Respiratory: Negative for cough, chest tightness, shortness of breath and wheezing.   Cardiovascular: Negative for chest pain.  Gastrointestinal: Negative for nausea, vomiting, abdominal pain, diarrhea and abdominal distention.  Endocrine: Negative for polydipsia, polyphagia and polyuria.  Genitourinary: Negative for dysuria, frequency and hematuria.  Musculoskeletal: Negative for gait problem.  Skin: Negative for color change, pallor and rash.  Neurological: Positive for tremors. Negative for dizziness, syncope, light-headedness and headaches.  Hematological: Does not bruise/bleed easily.  Psychiatric/Behavioral: Positive for hallucinations. Negative for behavioral problems and confusion.      Allergies  Aspirin  Home Medications   Prior to Admission medications   Medication Sig Start Date End Date Taking? Authorizing Provider  amLODipine (NORVASC) 10 MG tablet Take 1 tablet (10 mg total) by mouth daily. 03/16/15   Alycia Rossetti, MD  aspirin (ASPIRIN LOW DOSE) 81 MG EC tablet Take 81 mg by mouth daily.      Historical Provider,  MD  benzonatate (TESSALON PERLES) 100 MG capsule Take 1 capsule (100 mg total) by mouth 3 (three) times daily as needed for cough. 09/10/15   Alycia Rossetti, MD  Cholecalciferol (VITAMIN D3) 1000 UNITS CAPS Take 1 capsule by mouth daily.      Historical Provider, MD  doxepin (SINEQUAN) 25 MG capsule Take 1 capsule (25 mg total) by mouth at bedtime. 03/16/15   Alycia Rossetti, MD  doxycycline (VIBRA-TABS) 100 MG tablet Take 1 tablet (100 mg total) by mouth 2 (two) times daily. 09/10/15   Alycia Rossetti, MD  folic acid (FOLVITE) 1 MG tablet Take 1 mg by mouth daily.      Historical Provider, MD   HYDROcodone-acetaminophen (NORCO) 5-325 MG tablet Take 1 tablet by mouth every 8 (eight) hours as needed for moderate pain. For arthritis pain 10/05/15   Alycia Rossetti, MD  LORazepam (ATIVAN) 1 MG tablet Take 1 tablet (1 mg total) by mouth 3 (three) times daily as needed for anxiety. 10/31/15   Tanna Furry, MD  Multiple Vitamin (MULTIVITAMIN) tablet Take 1 tablet by mouth daily.      Historical Provider, MD  naproxen (NAPROSYN) 375 MG tablet TAKE 1 TABLET BY MOUTH TWICE DAILY WITH MEALS FOR ARTHRITIS. 09/19/15   Alycia Rossetti, MD  omeprazole (PRILOSEC) 20 MG capsule Take 1 capsule (20 mg total) by mouth daily. 03/16/15   Alycia Rossetti, MD   BP 142/76 mmHg  Pulse 125  Temp(Src) 98.2 F (36.8 C) (Oral)  Resp 18  Ht 5' 10.5" (1.791 m)  Wt 185 lb (83.915 kg)  BMI 26.16 kg/m2  SpO2 97% Physical Exam  Constitutional: He is oriented to person, place, and time. He appears well-developed and well-nourished. No distress.  HENT:  Head: Normocephalic.  Eyes: Conjunctivae are normal. Pupils are equal, round, and reactive to light. No scleral icterus.  Neck: Normal range of motion. Neck supple. No thyromegaly present.  Cardiovascular: Normal rate and regular rhythm.  Exam reveals no gallop and no friction rub.   No murmur heard. Pulmonary/Chest: Effort normal and breath sounds normal. No respiratory distress. He has no wheezes. He has no rales.  Abdominal: Soft. Bowel sounds are normal. He exhibits no distension. There is no tenderness. There is no rebound.  Musculoskeletal: Normal range of motion.  Neurological: He is alert and oriented to person, place, and time.  Final resting tremor. Awake alert oriented lucid. Cranial nerves intact symmetric. Normal peripheral neurological exam. Ambulatory.  Skin: Skin is warm and dry. No rash noted.  Psychiatric: He has a normal mood and affect. His behavior is normal.  Does not appear to be responding to any internal stimuli or actively hallucinating  during my exam.    ED Course  Procedures (including critical care time) Labs Review Labs Reviewed  CBC WITH DIFFERENTIAL/PLATELET - Abnormal; Notable for the following:    WBC 14.0 (*)    Neutro Abs 12.0 (*)    All other components within normal limits  COMPREHENSIVE METABOLIC PANEL - Abnormal; Notable for the following:    Potassium 3.1 (*)    Chloride 100 (*)    CO2 20 (*)    Glucose, Bld 105 (*)    BUN 28 (*)    AST 49 (*)    Total Bilirubin 2.2 (*)    GFR calc non Af Amer 51 (*)    GFR calc Af Amer 59 (*)    Anion gap 17 (*)    All other components  within normal limits  URINE RAPID DRUG SCREEN, HOSP PERFORMED  MAGNESIUM  ETHANOL    Imaging Review No results found. I have personally reviewed and evaluated these images and lab results as part of my medical decision-making.   EKG Interpretation None      MDM   Final diagnoses:  Withdrawal from sedative, hypnotic, or anxiolytic drug ([place])    Patient given IV fluids. Was given by mouth and IV Ativan. Heart rate initially 136. On my recheck he is resting comfortably and although not sleeping his eyes are closed. He awakens easily to voice. Heart rate 101. He is ambulatory back and forth to the bathroom. His resting tremor has improved. He has had no additional hallucinations.  After ambulating his heart rate goes up to 125. As I examine him again it is improved and is down to 102. He is not orthostatic and feels well. His son is here to take him home. Although he is 82 years old and showing signs of withdrawal, initial marked improvement here. I discussed with him treatment in the hospital versus at home and he is adamant that he wants to be treated as an outpatient. Do not think this is unreasonable. He has family to be with him. Asked him to return with any additional hallucinations sugars or other symptoms. Given him a prescription for 21 1 mg lorazepam tablets to use. Asked him to contact his primary care physician to  discuss continuing his medications.    Tanna Furry, MD 10/31/15 [phone]

## 2015-10-31 NOTE — ED Notes (Signed)
Pt reports ran out of hydrocodone and lorazepam a couple of days ago.  Also reports had stopped drinking but started back drinking heavily a week ago.  Last etoh was 2 or 3 days ago.  Pt shakey, reports visual hallucinations, and unsteady gait.  Pt fell today has skin tear to left elbow and left knee.

## 2015-11-01 ENCOUNTER — Ambulatory Visit (INDEPENDENT_AMBULATORY_CARE_PROVIDER_SITE_OTHER): Payer: Medicare Other | Admitting: Physician Assistant

## 2015-11-01 VITALS — BP 126/80 | HR 112 | Temp 98.2°F | Resp 18 | Wt 192.0 lb

## 2015-11-01 DIAGNOSIS — F10239 Alcohol dependence with withdrawal, unspecified: Secondary | ICD-10-CM | POA: Diagnosis not present

## 2015-11-01 DIAGNOSIS — F10939 Alcohol use, unspecified with withdrawal, unspecified: Secondary | ICD-10-CM
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
## 2015-11-05 ENCOUNTER — Telehealth: Payer: Self-pay | Admitting: Family Medicine

## 2015-11-05 ENCOUNTER — Other Ambulatory Visit: Payer: Self-pay | Admitting: Family Medicine

## 2015-11-05 MED ORDER — HYDROCODONE-ACETAMINOPHEN 5-325 MG PO TABS: 1.0000 | ORAL_TABLET | Freq: Three times a day (TID) | ORAL | Status: DC | PRN

## 2015-11-05 NOTE — Telephone Encounter (Signed)
Prescription printed and patient made aware to come to office to pick up on 11/06/2015.

## 2015-11-05 NOTE — Telephone Encounter (Signed)
Okay to refill? 

## 2015-11-05 NOTE — Telephone Encounter (Signed)
Pt needs a refill of Hydrocodone 5-325 [phone]

## 2015-11-05 NOTE — Telephone Encounter (Signed)
Ok to refill??  Last office visit 11/01/2015.  Last refill 10/05/2015.

## 2015-11-05 NOTE — Telephone Encounter (Signed)
Refill appropriate and filled per protocol. 

## 2015-12-05 ENCOUNTER — Other Ambulatory Visit: Payer: Self-pay | Admitting: Family Medicine

## 2015-12-05 ENCOUNTER — Telehealth: Payer: Self-pay | Admitting: Family Medicine

## 2015-12-05 MED ORDER — HYDROCODONE-ACETAMINOPHEN 5-325 MG PO TABS: 1.0000 | ORAL_TABLET | Freq: Three times a day (TID) | ORAL | Status: DC | PRN

## 2015-12-05 NOTE — Telephone Encounter (Signed)
Okay to refill give 3 

## 2015-12-05 NOTE — Telephone Encounter (Signed)
okay

## 2015-12-05 NOTE — Telephone Encounter (Signed)
Prescription printed and patient made aware to come to office to pick up after 2pm on 12/05/2015.

## 2015-12-05 NOTE — Telephone Encounter (Signed)
Ok to refill??  Last office visit 11/01/2015.  Last refill 11/05/2015.

## 2015-12-05 NOTE — Telephone Encounter (Addendum)
Patient needs rx for his hydrocodone  [phone]

## 2015-12-05 NOTE — Telephone Encounter (Signed)
Ok to refill??  Last office visit 11/01/2015.  Last refill 09/10/2015, #2 refills.

## 2015-12-06 NOTE — Telephone Encounter (Signed)
Medication called to pharmacy. 

## 2016-01-01 ENCOUNTER — Telehealth: Payer: Self-pay | Admitting: *Deleted

## 2016-01-01 NOTE — Telephone Encounter (Signed)
Received call from patient.   Requested refill on Norco.   Ok to refill??  Last office visit 11/01/2015.  Last refill 12/05/2015.

## 2016-01-01 NOTE — Telephone Encounter (Signed)
okay

## 2016-01-02 MED ORDER — HYDROCODONE-ACETAMINOPHEN 5-325 MG PO TABS: 1.0000 | ORAL_TABLET | Freq: Three times a day (TID) | ORAL | 0 refills | Status: DC | PRN

## 2016-01-02 NOTE — Telephone Encounter (Signed)
Prescription printed and patient made aware to come to office to pick up after 2pm on 01/02/2016.

## 2016-01-07 ENCOUNTER — Other Ambulatory Visit: Payer: Self-pay | Admitting: Family Medicine

## 2016-01-24 ENCOUNTER — Telehealth: Payer: Self-pay | Admitting: *Deleted

## 2016-01-24 NOTE — Telephone Encounter (Signed)
Records indicate prescription refill appropriate for Norco.  Ok to refill??  Last office visit 11/01/2015.  Last refill 01/02/2016.

## 2016-01-25 MED ORDER — HYDROCODONE-ACETAMINOPHEN 5-325 MG PO TABS: 1.0000 | ORAL_TABLET | Freq: Three times a day (TID) | ORAL | 0 refills | Status: DC | PRN

## 2016-01-25 NOTE — Telephone Encounter (Signed)
Prescription printed and patient made aware to come to office to pick up on 01/28/2016.

## 2016-01-25 NOTE — Telephone Encounter (Signed)
okay

## 2016-02-07 ENCOUNTER — Other Ambulatory Visit: Payer: Self-pay | Admitting: Family Medicine

## 2016-02-13 ENCOUNTER — Other Ambulatory Visit: Payer: Self-pay | Admitting: Family Medicine

## 2016-02-14 ENCOUNTER — Other Ambulatory Visit: Payer: Self-pay | Admitting: Family Medicine

## 2016-02-25 ENCOUNTER — Telehealth: Payer: Self-pay | Admitting: *Deleted

## 2016-02-25 MED ORDER — HYDROCODONE-ACETAMINOPHEN 5-325 MG PO TABS: 1.0000 | ORAL_TABLET | Freq: Three times a day (TID) | ORAL | 0 refills | Status: DC | PRN

## 2016-02-25 NOTE — Telephone Encounter (Signed)
Prescription printed and patient made aware to come to office to pick up on 02/26/2016.

## 2016-02-25 NOTE — Telephone Encounter (Signed)
okay

## 2016-02-25 NOTE — Telephone Encounter (Signed)
Received call from patient.   Requested refill on Hydrocodone.   Ok to refill??  Last office visit 11/01/2015.  Last refill 01/25/2016.

## 2016-03-10 ENCOUNTER — Encounter: Payer: Self-pay | Admitting: Family Medicine

## 2016-03-10 ENCOUNTER — Other Ambulatory Visit: Payer: Self-pay | Admitting: Family Medicine

## 2016-03-11 NOTE — Telephone Encounter (Signed)
Medication refill for one time only.  Patient needs to be seen.  Letter sent for patient to call and schedule 

## 2016-03-24 ENCOUNTER — Encounter: Payer: Self-pay | Admitting: Family Medicine

## 2016-03-24 ENCOUNTER — Ambulatory Visit (INDEPENDENT_AMBULATORY_CARE_PROVIDER_SITE_OTHER): Payer: Medicare Other | Admitting: Family Medicine

## 2016-03-24 ENCOUNTER — Other Ambulatory Visit: Payer: Self-pay | Admitting: Family Medicine

## 2016-03-24 VITALS — BP 130/70 | HR 100 | Temp 98.6°F | Resp 18 | Ht 68.0 in | Wt 208.0 lb

## 2016-03-24 DIAGNOSIS — Z23 Encounter for immunization: Secondary | ICD-10-CM

## 2016-03-24 DIAGNOSIS — F419 Anxiety disorder, unspecified: Secondary | ICD-10-CM | POA: Diagnosis not present

## 2016-03-24 DIAGNOSIS — Z79899 Other long term (current) drug therapy: Secondary | ICD-10-CM

## 2016-03-24 DIAGNOSIS — M170 Bilateral primary osteoarthritis of knee: Secondary | ICD-10-CM

## 2016-03-24 DIAGNOSIS — I10 Essential (primary) hypertension: Secondary | ICD-10-CM

## 2016-03-24 DIAGNOSIS — H6123 Impacted cerumen, bilateral: Secondary | ICD-10-CM | POA: Diagnosis not present

## 2016-03-24 MED ORDER — HYDROCODONE-ACETAMINOPHEN 5-325 MG PO TABS: 1.0000 | ORAL_TABLET | Freq: Three times a day (TID) | ORAL | 0 refills | Status: DC | PRN

## 2016-03-24 NOTE — Patient Instructions (Signed)
Flu shot given We will call with lab results F/U 4 months  

## 2016-03-24 NOTE — Assessment & Plan Note (Signed)
Continue doxepin as well as the lorazepam which she has been on for many years

## 2016-03-24 NOTE — Assessment & Plan Note (Signed)
Blood pressure controlled. 

## 2016-03-24 NOTE — Progress Notes (Signed)
Subjective:    Patient ID: [name], male    DOB: [birth year], 82 y.o.   MRN: [id number]  Patient presents for Medication Review/ Refill (requesting refill on Norco)  Patient here to follow-up medications. He is on hydrocodone secondary to generalized osteoarthritis he has not had any difficulties with his medication of falls or dizziness. His right knee is the worst, he is contemplating returning to ortho to discuss knee replacement   Chronic insomnia he is on doxepin which helps with sleep. He also takes lorazepam which she's been on this for many years for his anxiety. He was noted that he was seen in the emergency room after he ran out of his lorazepam is not clears once again or if he misplaced it. Urine drug screen however was done and was negative including for narcotics. He states that he did not take his pain medicine during the time when he was drinking he has been alcohol free since then. He states that he is taking his pain medicine at least twice a day  Hypertension taken blood pressure medicine as prescribed no difficulties  Needs ears cleaned out  Review Of Systems:  GEN- denies fatigue, fever, weight loss,weakness, recent illness HEENT- denies eye drainage, change in vision, nasal discharge, CVS- denies chest pain, palpitations RESP- denies SOB, cough, wheeze ABD- denies N/V, change in stools, abd pain GU- denies dysuria, hematuria, dribbling, incontinence MSK-+ joint pain, muscle aches, injury Neuro- denies headache, dizziness, syncope, seizure activity       Objective:    BP 130/70 (BP Location: Left Arm, Patient Position: Sitting, Cuff Size: Normal)   Pulse 100   Temp 98.6 F (37 C) (Oral)   Resp 18   Ht 5\' 8"  (1.727 m)   Wt 208 lb (94.3 kg)   SpO2 98% Comment: RA  BMI 31.63 kg/m  GEN- NAD, alert and oriented x3 HEENT- PERRL, EOMI, non injected sclera, pink conjunctiva, MMM, oropharynx clear, cerumun impaction bilat L >r, s/P REMOVAL Canals clear ,  TM in tact  CVS- RRR, no murmur RESP-CTAB Psych- normal affect and mood  EXT- No edema Pulses- Radial  2+        Assessment & Plan:      Problem List Items Addressed This Visit    OA (osteoarthritis) of knee    I will repeat his urine drug screen today. I did refill his hydrocodone today. Discussed with him that he cannot drink alcohol and take narcotics or his benzodiazepines. Although he is on both of these medications he is actually done fairly well has not had any falls or dizzy episodes.      Relevant Medications   HYDROcodone-acetaminophen (NORCO) 5-325 MG tablet   Other Relevant Orders   Drugs of abuse scrn w alc, routine urine   Hypertension - Primary    Blood pressure controlled      Relevant Orders   CBC with Differential/Platelet   Comprehensive metabolic panel   Anxiety    Continue doxepin as well as the lorazepam which she has been on for many years       Other Visit Diagnoses    Long-term use of high-risk medication       Relevant Orders   Drugs of abuse scrn w alc, routine urine   Bilateral impacted cerumen       Need for prophylactic vaccination and inoculation against influenza       Relevant Orders   Flu Vaccine QUAD 36+ mos PF IM (Fluarix &  Fluzone Quad PF) (Completed)      Note: This dictation was prepared with Dragon dictation along with smaller phrase technology. Any transcriptional errors that result from this process are unintentional.

## 2016-03-24 NOTE — Assessment & Plan Note (Signed)
I will repeat his urine drug screen today. I did refill his hydrocodone today. Discussed with him that he cannot drink alcohol and take narcotics or his benzodiazepines. Although he is on both of these medications he is actually done fairly well has not had any falls or dizzy episodes.

## 2016-03-25 LAB — COMPREHENSIVE METABOLIC PANEL
ALBUMIN: 4.4 g/dL (ref 3.6–5.1)
ALT: 13 U/L (ref 9–46)
AST: 17 U/L (ref 10–35)
Alkaline Phosphatase: 80 U/L (ref 40–115)
BUN: 17 mg/dL (ref 7–25)
CHLORIDE: 105 mmol/L (ref 98–110)
CO2: 23 mmol/L (ref 20–31)
CREATININE: 0.85 mg/dL (ref 0.70–1.11)
Calcium: 8.9 mg/dL (ref 8.6–10.3)
Glucose, Bld: 117 mg/dL — ABNORMAL HIGH (ref 70–99)
Potassium: 4.2 mmol/L (ref 3.5–5.3)
SODIUM: 141 mmol/L (ref 135–146)
TOTAL PROTEIN: 6.8 g/dL (ref 6.1–8.1)
Total Bilirubin: 0.5 mg/dL (ref 0.2–1.2)

## 2016-03-25 LAB — CBC WITH DIFFERENTIAL/PLATELET
BASOS ABS: 65 {cells}/uL (ref 0–200)
Basophils Relative: 1 %
EOS ABS: 325 {cells}/uL (ref 15–500)
EOS PCT: 5 %
HCT: 38.5 % (ref 38.5–50.0)
HEMOGLOBIN: 12.1 g/dL — AB (ref 13.0–17.0)
LYMPHS ABS: 1560 {cells}/uL (ref 850–3900)
Lymphocytes Relative: 24 %
MCH: 27.5 pg (ref 27.0–33.0)
MCHC: 31.4 g/dL — ABNORMAL LOW (ref 32.0–36.0)
MCV: 87.5 fL (ref 80.0–100.0)
MPV: 10.2 fL (ref 7.5–12.5)
Monocytes Absolute: 650 cells/uL (ref 200–950)
Monocytes Relative: 10 %
NEUTROS ABS: 3900 {cells}/uL (ref 1500–7800)
Neutrophils Relative %: 60 %
Platelets: 263 10*3/uL (ref 140–400)
RBC: 4.4 MIL/uL (ref 4.20–5.80)
RDW: 14.1 % (ref 11.0–15.0)
WBC: 6.5 10*3/uL (ref 3.8–10.8)

## 2016-03-27 ENCOUNTER — Other Ambulatory Visit: Payer: Self-pay | Admitting: Family Medicine

## 2016-03-27 NOTE — Telephone Encounter (Signed)
Ok to refill??  Last office visit 03/24/2016.  Last refill 12/06/2015, #3 refill.

## 2016-03-28 NOTE — Telephone Encounter (Signed)
okay

## 2016-03-28 NOTE — Telephone Encounter (Signed)
rx called in

## 2016-03-29 LAB — DRUG ABUSE 10-50+ETHANOL, U
ALCOHOL, ETHYL (U): NEGATIVE
AMPHETAMINES (1000 NG/ML SCRN): NEGATIVE
BARBITURATES: NEGATIVE
BENZODIAZEPINES: NEGATIVE
COCAINE METABOLITES: NEGATIVE
HYDROCODONE: POSITIVE — AB
HYDROMORPHONE: POSITIVE — AB
MARIJUANA MET (50 ng/mL SCRN): NEGATIVE
METHADONE: NEGATIVE
METHAQUALONE: NEGATIVE
OPIATES: POSITIVE — AB
PHENCYCLIDINE: NEGATIVE
PROPOXYPHENE: NEGATIVE

## 2016-04-07 ENCOUNTER — Other Ambulatory Visit: Payer: Self-pay | Admitting: Family Medicine

## 2016-04-07 NOTE — Telephone Encounter (Signed)
Medication refilled per protocol. 

## 2016-04-11 ENCOUNTER — Other Ambulatory Visit: Payer: Self-pay | Admitting: Family Medicine

## 2016-04-18 ENCOUNTER — Other Ambulatory Visit: Payer: Self-pay | Admitting: Family Medicine

## 2016-04-18 NOTE — Telephone Encounter (Signed)
Okay to refill? 

## 2016-04-18 NOTE — Telephone Encounter (Signed)
LRF 03/24/16 #80  LOV  03/24/16  OK refill?

## 2016-04-21 MED ORDER — HYDROCODONE-ACETAMINOPHEN 5-325 MG PO TABS: 1.0000 | ORAL_TABLET | Freq: Three times a day (TID) | ORAL | 0 refills | Status: DC | PRN

## 2016-04-21 NOTE — Telephone Encounter (Signed)
Pt called, told pick up after 2PM today.

## 2016-04-23 ENCOUNTER — Ambulatory Visit (INDEPENDENT_AMBULATORY_CARE_PROVIDER_SITE_OTHER): Payer: Medicare Other | Admitting: Family Medicine

## 2016-04-23 ENCOUNTER — Encounter: Payer: Self-pay | Admitting: Family Medicine

## 2016-04-23 VITALS — BP 128/78 | HR 102 | Temp 99.1°F | Resp 14 | Ht 68.0 in | Wt 205.0 lb

## 2016-04-23 DIAGNOSIS — F419 Anxiety disorder, unspecified: Secondary | ICD-10-CM | POA: Diagnosis not present

## 2016-04-23 DIAGNOSIS — F5101 Primary insomnia: Secondary | ICD-10-CM

## 2016-04-23 MED ORDER — AMLODIPINE BESYLATE 10 MG PO TABS: 10.0000 mg | ORAL_TABLET | Freq: Every day | ORAL | 6 refills | Status: DC

## 2016-04-23 MED ORDER — DOXEPIN HCL 25 MG PO CAPS: 25.0000 mg | ORAL_CAPSULE | Freq: Every day | ORAL | 6 refills | Status: DC

## 2016-04-23 MED ORDER — LORAZEPAM 1 MG PO TABS: 1.0000 mg | ORAL_TABLET | Freq: Two times a day (BID) | ORAL | 0 refills | Status: DC | PRN

## 2016-04-23 NOTE — Patient Instructions (Addendum)
F/U 4 months You can get your ativan on Monday, it will be 60 pills, to last 1 month (30 days)

## 2016-04-23 NOTE — Progress Notes (Signed)
   Subjective:    Patient ID: [name], male    DOB: 1927-10-01, 82 y.o.   MRN: [id number]  Patient presents for Follow-up (is not fasting) Issue here to discuss his drug screen results. He had a drug screen done probably 4 weeks ago at his follow-up visit his benzodiazepines are negative but his narcotics were found in his urine. Of note he had a negative drug screen for both back in May however he does admit that he had been drinking at that time and he does not think that he had his medicines as somebody probably stole it. He denies anyone has stolen his medication recently then he starts and that he thinks that he may be dropping some as they're very small. He denies any dye version of his medication. I called the pharmacy and looked on the [place] drug database he's been getting his medication filled at least 1-2 days early every month. He has not been taking the doxepin   Review Of Systems:  GEN- denies fatigue, fever, weight loss,weakness, recent illness HEENT- denies eye drainage, change in vision, nasal discharge, CVS- denies chest pain, palpitations RESP- denies SOB, cough, wheeze ABD- denies N/V, change in stools, abd pain GU- denies dysuria, hematuria, dribbling, incontinence MSK- denies joint pain, muscle aches, injury Neuro- denies headache, dizziness, syncope, seizure activity       Objective:    BP 128/78 (BP Location: Left Arm, Patient Position: Sitting, Cuff Size: Large)   Pulse (!) 102   Temp 99.1 F (37.3 C) (Oral)   Resp 14   Ht 5\' 8"  (1.727 m)   Wt 205 lb (93 kg)   SpO2 98%   BMI 31.17 kg/m  GEN- NAD, alert and oriented x3 Psych- normal affect and mood        Assessment & Plan:      Problem List Items Addressed This Visit    Insomnia    Action with patient he has been on benzodiazepines for many years now but now is unclear. His actually taking or what is going on with his medication. I'm going to decrease his lorazepam down to 1 mg  twice a day I will do another random drug screening. I also reiterated the importance of taking the doxepin and this will help with his anxiety and his sleep. Even though he's not had knee falls or other issues with his medication using the smallest amount is deathly in his best interest anyway.  He cannot get his medication until Sunday it must last 30 days      Anxiety - Primary   Relevant Medications   doxepin (SINEQUAN) 25 MG capsule   LORazepam (ATIVAN) 1 MG tablet      Note: This dictation was prepared with Dragon dictation along with smaller phrase technology. Any transcriptional errors that result from this process are unintentional.

## 2016-04-23 NOTE — Assessment & Plan Note (Addendum)
Action with patient he has been on benzodiazepines for many years now but now is unclear. His actually taking or what is going on with his medication. I'm going to decrease his lorazepam down to 1 mg twice a day I will do another random drug screening. I also reiterated the importance of taking the doxepin and this will help with his anxiety and his sleep. Even though he's not had knee falls or other issues with his medication using the smallest amount is deathly in his best interest anyway.  He cannot get his medication until Sunday it must last 30 days

## 2016-05-09 ENCOUNTER — Other Ambulatory Visit: Payer: Self-pay | Admitting: Family Medicine

## 2016-05-19 ENCOUNTER — Telehealth: Payer: Self-pay | Admitting: Family Medicine

## 2016-05-19 MED ORDER — HYDROCODONE-ACETAMINOPHEN 5-325 MG PO TABS: 1.0000 | ORAL_TABLET | Freq: Three times a day (TID) | ORAL | 0 refills | Status: DC | PRN

## 2016-05-19 NOTE — Telephone Encounter (Signed)
Ok to refill??  Last office visit 04/23/2016.   Last refill 04/21/2016.

## 2016-05-19 NOTE — Telephone Encounter (Signed)
Requesting rx for hydrocodone  [phone]

## 2016-05-19 NOTE — Telephone Encounter (Signed)
Prescription printed and patient made aware to come to office to pick up after 2 pm on 05/19/2016 per VM.

## 2016-05-19 NOTE — Telephone Encounter (Signed)
Okay to refill? 

## 2016-05-23 ENCOUNTER — Other Ambulatory Visit: Payer: Self-pay | Admitting: Family Medicine

## 2016-05-23 NOTE — Telephone Encounter (Signed)
okay

## 2016-05-23 NOTE — Telephone Encounter (Signed)
Ok to refill??  Last office visit/ refill 04/23/2016.

## 2016-05-23 NOTE — Telephone Encounter (Signed)
Medication called to pharmacy. 

## 2016-05-28 ENCOUNTER — Other Ambulatory Visit: Payer: Self-pay | Admitting: Family Medicine

## 2016-06-07 ENCOUNTER — Other Ambulatory Visit: Payer: Self-pay | Admitting: Family Medicine

## 2016-06-17 ENCOUNTER — Encounter: Payer: Self-pay | Admitting: Family Medicine

## 2016-06-20 ENCOUNTER — Telehealth: Payer: Self-pay | Admitting: Family Medicine

## 2016-06-20 MED ORDER — HYDROCODONE-ACETAMINOPHEN 5-325 MG PO TABS: 1.0000 | ORAL_TABLET | Freq: Three times a day (TID) | ORAL | 0 refills | Status: DC | PRN

## 2016-06-20 NOTE — Telephone Encounter (Signed)
Prescription printed and patient made aware to come to office to pick up after 4pm on 06/20/2016.

## 2016-06-20 NOTE — Telephone Encounter (Signed)
Asking for rx for his hydrocodone  [phone] Would like to get today if possible

## 2016-06-20 NOTE — Telephone Encounter (Signed)
Ok to refill??  Last office visit 04/23/2016.  Last refill 05/19/2016.

## 2016-06-20 NOTE — Telephone Encounter (Signed)
okay

## 2016-06-24 ENCOUNTER — Other Ambulatory Visit: Payer: Self-pay | Admitting: Family Medicine

## 2016-06-24 NOTE — Telephone Encounter (Signed)
Ok to refill??  Last office visit 04/23/2016.  Last refill 05/23/2016.

## 2016-06-25 NOTE — Telephone Encounter (Signed)
okay

## 2016-06-25 NOTE — Telephone Encounter (Signed)
Medication called to pharmacy. 

## 2016-07-09 ENCOUNTER — Other Ambulatory Visit: Payer: Self-pay | Admitting: Family Medicine

## 2016-07-21 ENCOUNTER — Other Ambulatory Visit: Payer: Self-pay | Admitting: Family Medicine

## 2016-07-21 NOTE — Telephone Encounter (Signed)
LRF 06/20/16 #80  LOV 04/23/16  OK refill?

## 2016-07-21 NOTE — Telephone Encounter (Signed)
okay

## 2016-07-22 MED ORDER — HYDROCODONE-ACETAMINOPHEN 5-325 MG PO TABS: 1.0000 | ORAL_TABLET | Freq: Three times a day (TID) | ORAL | 0 refills | Status: DC | PRN

## 2016-07-22 NOTE — Telephone Encounter (Signed)
Rx ready and pt aware ready for pick up

## 2016-07-24 ENCOUNTER — Other Ambulatory Visit: Payer: Self-pay | Admitting: Family Medicine

## 2016-07-24 NOTE — Telephone Encounter (Signed)
Okay give 2

## 2016-07-24 NOTE — Telephone Encounter (Signed)
Ok to refill??  Last office visit 04/23/2016.  Last refill 06/25/2016.

## 2016-07-28 ENCOUNTER — Other Ambulatory Visit: Payer: Self-pay | Admitting: Family Medicine

## 2016-08-09 ENCOUNTER — Other Ambulatory Visit: Payer: Self-pay | Admitting: Family Medicine

## 2016-08-18 ENCOUNTER — Telehealth: Payer: Self-pay | Admitting: Family Medicine

## 2016-08-18 MED ORDER — HYDROCODONE-ACETAMINOPHEN 5-325 MG PO TABS: 1.0000 | ORAL_TABLET | Freq: Three times a day (TID) | ORAL | 0 refills | Status: DC | PRN

## 2016-08-18 NOTE — Telephone Encounter (Signed)
Prescription printed and patient made aware to come to office to pick up on 08/19/2016.

## 2016-08-18 NOTE — Telephone Encounter (Signed)
okay

## 2016-08-18 NOTE — Telephone Encounter (Signed)
Ok to refill??  Last office visit 04/23/2016.  Last refill 07/22/2016.

## 2016-08-18 NOTE — Telephone Encounter (Signed)
Pt needs refill on hydrocodone.  °

## 2016-08-20 ENCOUNTER — Other Ambulatory Visit: Payer: Self-pay | Admitting: Family Medicine

## 2016-08-20 NOTE — Telephone Encounter (Signed)
Medication called to pharmacy. 

## 2016-08-20 NOTE — Telephone Encounter (Signed)
Ok to refill??  Last office visit 04/23/2016.  Last refill 07/24/2016.

## 2016-08-20 NOTE — Telephone Encounter (Signed)
Okay to refiill

## 2016-09-09 ENCOUNTER — Other Ambulatory Visit: Payer: Self-pay | Admitting: Family Medicine

## 2016-09-18 ENCOUNTER — Telehealth: Payer: Self-pay | Admitting: Family Medicine

## 2016-09-18 NOTE — Telephone Encounter (Signed)
Pt needs refill on hydrocodone.  °

## 2016-09-18 NOTE — Telephone Encounter (Signed)
Ok to refill??  Last office visit 04/23/2016.  Last refill 08/18/2016.

## 2016-09-19 MED ORDER — HYDROCODONE-ACETAMINOPHEN 5-325 MG PO TABS: 1.0000 | ORAL_TABLET | Freq: Three times a day (TID) | ORAL | 0 refills | Status: DC | PRN

## 2016-09-19 NOTE — Telephone Encounter (Signed)
Prescription printed and patient made aware to come to office to pick up.  

## 2016-09-19 NOTE — Telephone Encounter (Signed)
okay

## 2016-09-19 NOTE — Addendum Note (Signed)
Addended by: Sheral Flow on: 09/19/2016 12:10 PM   Modules accepted: Orders

## 2016-10-01 ENCOUNTER — Other Ambulatory Visit: Payer: Self-pay | Admitting: Family Medicine

## 2016-10-10 ENCOUNTER — Other Ambulatory Visit: Payer: Self-pay | Admitting: Family Medicine

## 2016-10-18 ENCOUNTER — Other Ambulatory Visit: Payer: Self-pay | Admitting: Family Medicine

## 2016-10-20 NOTE — Telephone Encounter (Signed)
okay

## 2016-10-20 NOTE — Telephone Encounter (Signed)
Ok to refill??  Last office visit 04/23/2016.  Last refill 09/19/2016.

## 2016-10-20 NOTE — Telephone Encounter (Signed)
Prescription printed and patient made aware to come to office to pick up.  

## 2016-10-28 ENCOUNTER — Other Ambulatory Visit: Payer: Self-pay | Admitting: Family Medicine

## 2016-11-13 ENCOUNTER — Other Ambulatory Visit: Payer: Self-pay | Admitting: Family Medicine

## 2016-11-18 ENCOUNTER — Other Ambulatory Visit: Payer: Self-pay | Admitting: Family Medicine

## 2016-11-18 NOTE — Telephone Encounter (Signed)
Medication called to pharmacy. 

## 2016-11-18 NOTE — Telephone Encounter (Signed)
Okay, needs OV

## 2016-11-18 NOTE — Telephone Encounter (Signed)
Ok to refill??  Last office visit 04/28/2016.  Last refill 08/20/2016, #1 refill.

## 2016-11-28 ENCOUNTER — Encounter: Payer: Self-pay | Admitting: Family Medicine

## 2016-11-28 ENCOUNTER — Ambulatory Visit (INDEPENDENT_AMBULATORY_CARE_PROVIDER_SITE_OTHER): Payer: Medicare Other | Admitting: Family Medicine

## 2016-11-28 VITALS — BP 122/78 | HR 98 | Temp 97.9°F | Resp 14 | Ht 68.0 in | Wt 205.0 lb

## 2016-11-28 DIAGNOSIS — I10 Essential (primary) hypertension: Secondary | ICD-10-CM

## 2016-11-28 DIAGNOSIS — M170 Bilateral primary osteoarthritis of knee: Secondary | ICD-10-CM

## 2016-11-28 DIAGNOSIS — F419 Anxiety disorder, unspecified: Secondary | ICD-10-CM

## 2016-11-28 DIAGNOSIS — F5101 Primary insomnia: Secondary | ICD-10-CM

## 2016-11-28 DIAGNOSIS — E782 Mixed hyperlipidemia: Secondary | ICD-10-CM

## 2016-11-28 LAB — CBC WITH DIFFERENTIAL/PLATELET
BASOS ABS: 73 {cells}/uL (ref 0–200)
Basophils Relative: 1 %
EOS ABS: 438 {cells}/uL (ref 15–500)
Eosinophils Relative: 6 %
HCT: 31.3 % — ABNORMAL LOW (ref 38.5–50.0)
HEMOGLOBIN: 9.4 g/dL — AB (ref 13.0–17.0)
LYMPHS ABS: 1460 {cells}/uL (ref 850–3900)
Lymphocytes Relative: 20 %
MCH: 24 pg — AB (ref 27.0–33.0)
MCHC: 30 g/dL — ABNORMAL LOW (ref 32.0–36.0)
MCV: 80.1 fL (ref 80.0–100.0)
MONO ABS: 584 {cells}/uL (ref 200–950)
MPV: 9.4 fL (ref 7.5–12.5)
Monocytes Relative: 8 %
NEUTROS ABS: 4745 {cells}/uL (ref 1500–7800)
Neutrophils Relative %: 65 %
Platelets: 313 10*3/uL (ref 140–400)
RBC: 3.91 MIL/uL — ABNORMAL LOW (ref 4.20–5.80)
RDW: 16.7 % — ABNORMAL HIGH (ref 11.0–15.0)
WBC: 7.3 10*3/uL (ref 3.8–10.8)

## 2016-11-28 LAB — COMPREHENSIVE METABOLIC PANEL
ALT: 11 U/L (ref 9–46)
AST: 17 U/L (ref 10–35)
Albumin: 4.2 g/dL (ref 3.6–5.1)
Alkaline Phosphatase: 85 U/L (ref 40–115)
BUN: 18 mg/dL (ref 7–25)
CALCIUM: 8.8 mg/dL (ref 8.6–10.3)
CHLORIDE: 106 mmol/L (ref 98–110)
CO2: 20 mmol/L (ref 20–31)
Creat: 1.09 mg/dL (ref 0.70–1.11)
GLUCOSE: 132 mg/dL — AB (ref 70–99)
POTASSIUM: 4.2 mmol/L (ref 3.5–5.3)
Sodium: 137 mmol/L (ref 135–146)
Total Bilirubin: 0.5 mg/dL (ref 0.2–1.2)
Total Protein: 6.7 g/dL (ref 6.1–8.1)

## 2016-11-28 LAB — LIPID PANEL
CHOL/HDL RATIO: 6.1 ratio — AB (ref <5.0)
CHOLESTEROL: 182 mg/dL (ref <200)
HDL: 30 mg/dL — AB (ref >40)
LDL Cholesterol: 106 mg/dL — ABNORMAL HIGH (ref <100)
Triglycerides: 232 mg/dL — ABNORMAL HIGH (ref <150)
VLDL: 46 mg/dL — AB (ref <30)

## 2016-11-28 MED ORDER — HYDROCODONE-ACETAMINOPHEN 5-325 MG PO TABS: ORAL_TABLET | ORAL | 0 refills | Status: DC

## 2016-11-28 NOTE — Progress Notes (Signed)
    Subjective:    Patient ID: [name], male    DOB: [birth year], 83 y.o.   MRN: [id number]  Patient presents for Follow-up (is not fasting)   Pt here to f/u chronic medical problems      HTN- taking BP meds as prescribed, due for rpeat labs today    Anxiety- taking ativan as prescribed, no falls, tolearting medication as prescribed   Osteoarthritis-He is taking his pain medicine as prescribed. He did ask for increased on his pain medicine  Review Of Systems:  GEN- denies fatigue, fever, weight loss,weakness, recent illness HEENT- denies eye drainage, change in vision, nasal discharge, CVS- denies chest pain, palpitations RESP- denies SOB, cough, wheeze ABD- denies N/V, change in stools, abd pain GU- denies dysuria, hematuria, dribbling, incontinence MSK-+ joint pain, denies  muscle aches, injury Neuro- denies headache, dizziness, syncope, seizure activity       Objective:    BP 122/78   Pulse 98   Temp 97.9 F (36.6 C) (Oral)   Resp 14   Ht 5\' 8"  (1.727 m)   Wt 205 lb (93 kg)   SpO2 100%   BMI 31.17 kg/m  GEN- NAD, alert and oriented x3 HEENT- PERRL, EOMI, non injected sclera, pink conjunctiva, MMM, oropharynx clear CVS- RRR, no murmur RESP-CTAB Psych- normal affect and mood  EXT- No edema Pulses- Radial  2+        Assessment & Plan:      Problem List Items Addressed This Visit    OA (osteoarthritis) of knee - Primary    Declined to increase on his pain medication specialist and being on lorazepam which he has been on those now for quite some time. Recommended he go back to see orthopedics for possible injections but he declined this. He can use topicals for the joint as well.      Relevant Medications   HYDROcodone-acetaminophen (NORCO/VICODIN) 5-325 MG tablet   Insomnia    Continue current regimen he continues to get benefits.      Hypertension    Well-controlled medication medication.      Relevant Orders   Comprehensive metabolic  panel (Completed)   CBC with Differential/Platelet (Completed)   Hyperlipidemia    Recheck cholesterol he is nonfasting. Expect triglycerides to be elevated.      Relevant Orders   Lipid panel (Completed)   Anxiety    Continue with lorazepam and doxepin he has not had any falls or adverse effects of the lorazepam         Note: This dictation was prepared with Dragon dictation along with smaller phrase technology. Any transcriptional errors that result from this process are unintentional.

## 2016-11-28 NOTE — Patient Instructions (Addendum)
F/U 4 months PHYSICAL  

## 2016-12-01 NOTE — Assessment & Plan Note (Addendum)
Declined to increase on his pain medication specialist and being on lorazepam which he has been on those now for quite some time. Recommended he go back to see orthopedics for possible injections but he declined this. He can use topicals for the joint as well.

## 2016-12-01 NOTE — Assessment & Plan Note (Signed)
Well-controlled medication medication.

## 2016-12-01 NOTE — Assessment & Plan Note (Signed)
Recheck cholesterol he is nonfasting. Expect triglycerides to be elevated.

## 2016-12-01 NOTE — Assessment & Plan Note (Signed)
Continue current regimen he continues to get benefits.

## 2016-12-01 NOTE — Assessment & Plan Note (Signed)
Continue with lorazepam and doxepin he has not had any falls or adverse effects of the lorazepam

## 2016-12-02 ENCOUNTER — Other Ambulatory Visit: Payer: Self-pay | Admitting: Family Medicine

## 2016-12-05 ENCOUNTER — Other Ambulatory Visit: Payer: Medicare Other

## 2016-12-05 ENCOUNTER — Other Ambulatory Visit: Payer: Self-pay | Admitting: *Deleted

## 2016-12-05 DIAGNOSIS — D649 Anemia, unspecified: Secondary | ICD-10-CM | POA: Diagnosis not present

## 2016-12-05 LAB — CBC WITH DIFFERENTIAL/PLATELET
BASOS ABS: 82 {cells}/uL (ref 0–200)
BASOS PCT: 1 %
EOS ABS: 574 {cells}/uL — AB (ref 15–500)
Eosinophils Relative: 7 %
HCT: 29.7 % — ABNORMAL LOW (ref 38.5–50.0)
HEMOGLOBIN: 9.3 g/dL — AB (ref 13.0–17.0)
LYMPHS ABS: 1476 {cells}/uL (ref 850–3900)
Lymphocytes Relative: 18 %
MCH: 24.6 pg — AB (ref 27.0–33.0)
MCHC: 31.3 g/dL — ABNORMAL LOW (ref 32.0–36.0)
MCV: 78.6 fL — AB (ref 80.0–100.0)
MONO ABS: 656 {cells}/uL (ref 200–950)
MPV: 9.4 fL (ref 7.5–12.5)
Monocytes Relative: 8 %
NEUTROS ABS: 5412 {cells}/uL (ref 1500–7800)
Neutrophils Relative %: 66 %
Platelets: 331 10*3/uL (ref 140–400)
RBC: 3.78 MIL/uL — ABNORMAL LOW (ref 4.20–5.80)
RDW: 16.9 % — ABNORMAL HIGH (ref 11.0–15.0)
WBC: 8.2 10*3/uL (ref 3.8–10.8)

## 2016-12-06 ENCOUNTER — Other Ambulatory Visit: Payer: Self-pay | Admitting: Family Medicine

## 2016-12-06 DIAGNOSIS — D649 Anemia, unspecified: Secondary | ICD-10-CM | POA: Diagnosis not present

## 2016-12-06 LAB — IRON,TIBC AND FERRITIN PANEL
%SAT: 4 % — ABNORMAL LOW (ref 15–60)
Ferritin: 10 ng/mL — ABNORMAL LOW (ref 20–380)
Iron: 18 ug/dL — ABNORMAL LOW (ref 50–180)
TIBC: 486 ug/dL — AB (ref 250–425)

## 2016-12-07 ENCOUNTER — Other Ambulatory Visit: Payer: Self-pay | Admitting: Family Medicine

## 2016-12-07 DIAGNOSIS — D649 Anemia, unspecified: Secondary | ICD-10-CM | POA: Diagnosis not present

## 2016-12-08 ENCOUNTER — Other Ambulatory Visit: Payer: Self-pay | Admitting: Family Medicine

## 2016-12-08 DIAGNOSIS — D649 Anemia, unspecified: Secondary | ICD-10-CM | POA: Diagnosis not present

## 2016-12-09 ENCOUNTER — Other Ambulatory Visit: Payer: Self-pay | Admitting: *Deleted

## 2016-12-09 DIAGNOSIS — D509 Iron deficiency anemia, unspecified: Secondary | ICD-10-CM

## 2016-12-09 LAB — FECAL GLOBIN BY IMMUNOCHEMISTRY
FECAL GLOBIN IMMUNO: DETECTED — AB
Fecal Globin Immuno: NOT DETECTED
Fecal Globin Immuno: DETECTED — AB

## 2016-12-09 MED ORDER — FERROUS SULFATE 325 (65 FE) MG PO TBEC: 325.0000 mg | DELAYED_RELEASE_TABLET | Freq: Every day | ORAL | 3 refills | Status: AC

## 2016-12-12 ENCOUNTER — Other Ambulatory Visit: Payer: Self-pay | Admitting: *Deleted

## 2016-12-12 DIAGNOSIS — R195 Other fecal abnormalities: Secondary | ICD-10-CM

## 2016-12-12 DIAGNOSIS — D509 Iron deficiency anemia, unspecified: Secondary | ICD-10-CM

## 2016-12-15 ENCOUNTER — Other Ambulatory Visit: Payer: Self-pay | Admitting: Family Medicine

## 2016-12-16 ENCOUNTER — Encounter: Payer: Self-pay | Admitting: Internal Medicine

## 2016-12-18 ENCOUNTER — Other Ambulatory Visit: Payer: Self-pay | Admitting: Family Medicine

## 2016-12-18 NOTE — Telephone Encounter (Signed)
Medication called to pharmacy. 

## 2016-12-18 NOTE — Telephone Encounter (Signed)
Ok to refill??  Last office visit 11/28/2016.  Last refill 11/18/2016.

## 2016-12-18 NOTE — Telephone Encounter (Signed)
Okay to refill give 2 

## 2016-12-22 ENCOUNTER — Other Ambulatory Visit: Payer: Self-pay | Admitting: Family Medicine

## 2016-12-26 ENCOUNTER — Telehealth: Payer: Self-pay | Admitting: *Deleted

## 2016-12-26 ENCOUNTER — Other Ambulatory Visit: Payer: Self-pay | Admitting: Family Medicine

## 2016-12-26 MED ORDER — HYDROCODONE-ACETAMINOPHEN 5-325 MG PO TABS: ORAL_TABLET | ORAL | 0 refills | Status: DC

## 2016-12-26 NOTE — Telephone Encounter (Signed)
okay

## 2016-12-26 NOTE — Telephone Encounter (Signed)
Received call from patient.   Requested refill on Hydrocodone.   Ok to refill??  Last office visit/ refill 11/28/2016.

## 2016-12-26 NOTE — Telephone Encounter (Signed)
Prescription printed and patient made aware to come to office to pick up after 4pm on 12/26/2016.  

## 2017-01-09 ENCOUNTER — Other Ambulatory Visit: Payer: Self-pay

## 2017-01-13 ENCOUNTER — Other Ambulatory Visit: Payer: Self-pay | Admitting: Family Medicine

## 2017-01-21 ENCOUNTER — Telehealth: Payer: Self-pay | Admitting: *Deleted

## 2017-01-21 MED ORDER — HYDROCODONE-ACETAMINOPHEN 5-325 MG PO TABS: ORAL_TABLET | ORAL | 0 refills | Status: DC

## 2017-01-21 NOTE — Telephone Encounter (Signed)
Okay to refill? 

## 2017-01-21 NOTE — Telephone Encounter (Signed)
Received call from patient.   Requested refill on hydrocodone.   Ok to refill??  Last office visit 11/28/2016.  Last refill 12/26/2016.

## 2017-01-22 NOTE — Telephone Encounter (Signed)
Prescription printed and patient made aware to come to office to pick up.  

## 2017-02-10 ENCOUNTER — Ambulatory Visit (INDEPENDENT_AMBULATORY_CARE_PROVIDER_SITE_OTHER): Payer: Medicare Other | Admitting: Gastroenterology

## 2017-02-10 ENCOUNTER — Other Ambulatory Visit: Payer: Self-pay

## 2017-02-10 ENCOUNTER — Other Ambulatory Visit: Payer: Self-pay | Admitting: Family Medicine

## 2017-02-10 ENCOUNTER — Encounter: Payer: Self-pay | Admitting: Gastroenterology

## 2017-02-10 DIAGNOSIS — D509 Iron deficiency anemia, unspecified: Secondary | ICD-10-CM | POA: Insufficient documentation

## 2017-02-10 DIAGNOSIS — D500 Iron deficiency anemia secondary to blood loss (chronic): Secondary | ICD-10-CM

## 2017-02-10 DIAGNOSIS — R195 Other fecal abnormalities: Secondary | ICD-10-CM | POA: Diagnosis not present

## 2017-02-10 DIAGNOSIS — K921 Melena: Secondary | ICD-10-CM

## 2017-02-10 DIAGNOSIS — Z86010 Personal history of colonic polyps: Secondary | ICD-10-CM

## 2017-02-10 MED ORDER — PEG 3350-KCL-NA BICARB-NACL 420 G PO SOLR: 4000.0000 mL | ORAL | 0 refills | Status: DC

## 2017-02-10 NOTE — Patient Instructions (Signed)
1. Colonoscopy and possible upper endoscopy as scheduled. Please see separate instructions.

## 2017-02-10 NOTE — Progress Notes (Signed)
CC'ED TO PCP 

## 2017-02-10 NOTE — Assessment & Plan Note (Signed)
83 year old gentleman presenting for iron deficiency anemia, heme positive stool. He is on low-dose aspirin, Naprosyn. History of remote colonoscopy with tubulovillous adenoma. Prior history of ulcerative reflux esophagitis with lesion worrisome for malignancy however negative biopsies at time of EUS in 2008. Patient denies GI symptoms.  Recommend colonoscopy with possible upper endoscopy in the near future. Plan for deep sedation in the OR due to history of failed conscious sedation, polypharmacy.   I have discussed the risks, alternatives, benefits with regards to but not limited to the risk of reaction to medication, bleeding, infection, perforation and the patient is agreeable to proceed. Written consent to be obtained.  He will be getting preop labs which would include CBC typically. Follow-up on his anemia then.

## 2017-02-10 NOTE — Progress Notes (Signed)
Primary Care Physician:  Alycia Rossetti, MD  Primary Gastroenterologist:  Garfield Cornea, MD   Chief Complaint  Patient presents with  . Blood In Stools  . Anemia    HPI:  [name] is a 83 y.o. male here At the request of Dr. Buelah Manis for further evaluation of iron deficiency anemia, Hemoccult-positive stool. Patient had labs back in June, hemoglobin down to 9.4, had been low 12 range in October 2017. Iron studies consistent with IDA. Heme positive stool.  Looking back patient realizes that he had DOE for past year. Since on iron, has been a lot better. Vegetarian, partial. BM regular. No melena, brbpr. No abdominal pain. No dysphagia. No gerd. No n/v. Chronic knee pain.   Last colonoscopy in 2012 per patient but I don't see a report. I see 2008 colonoscopy by Dr. Gala Romney, he had tubulovillous adenoma at the time. We did send him a recall letter 2012 but again don't see any verification that he actually had a colonoscopy.   Patient tells me he had a terrible experience with [place] colonoscopy. He was not adequately sedated.   Current Outpatient Prescriptions  Medication Sig Dispense Refill  . amLODipine (NORVASC) 10 MG tablet TAKE ONE TABLET BY MOUTH DAILY FOR BLOOD PRESSURE. 30 tablet 2  . aspirin (ASPIRIN LOW DOSE) 81 MG EC tablet Take 81 mg by mouth daily.      . Cholecalciferol (VITAMIN D3) 1000 UNITS CAPS Take 1 capsule by mouth daily.      Marland Kitchen doxepin (SINEQUAN) 25 MG capsule TAKE (1) CAPSULE BY MOUTH AT BEDTIME. 30 capsule 3  . ferrous sulfate (FE TABS) 325 (65 FE) MG EC tablet Take 1 tablet (325 mg total) by mouth daily with breakfast.  3  . folic acid (FOLVITE) 1 MG tablet Take 1 mg by mouth daily.      Marland Kitchen HYDROcodone-acetaminophen (NORCO/VICODIN) 5-325 MG tablet TAKE 1 TABLET BY MOUTH EVERY EIGHT HOURS AS NEEDED FOR MODERATE ARTHRITIS PAIN. 80 tablet 0  . LORazepam (ATIVAN) 1 MG tablet TAKE (1) TABLET BY MOUTH TWICE A DAY AS NEEDED. 60 tablet 2  . Multiple Vitamin (MULTIVITAMIN)  tablet Take 1 tablet by mouth daily.      . naproxen (NAPROSYN) 375 MG tablet TAKE 1 TABLET BY MOUTH TWICE DAILY WITH MEALS FOR ARTHRITIS. 60 tablet 0  . omeprazole (PRILOSEC) 20 MG capsule TAKE 1 CAPSULE BY MOUTH DAILY. 30 capsule 0   No current facility-administered medications for this visit.     Allergies as of 02/10/2017 - Review Complete 02/10/2017  Allergen Reaction Noted  . Aspirin Other (See Comments) 08/24/2007    Past Medical History:  Diagnosis Date  . Acid reflux   . Alcoholism /alcohol abuse ([place])   . Anemia   . Arthritis    OA  . Barrett's esophagus    ????  . Colon polyp   . Depression   . Dizzy   . H/O: UGI bleed   . Hearing impairment   . Hyperlipidemia   . Hypertension   . Knee pain   . MRSA (methicillin resistant staph aureus) culture positive    Sepsis of right knee  . Panic attacks   . Thrombocytopenia ([place])    Mild    Past Surgical History:  Procedure Laterality Date  . APPENDECTOMY    . bilateral cataract surgery    . CARDIAC CATHETERIZATION    . COLONOSCOPY  05/13/2007   Dr. Gala Romney: Diminutive rectosigmoid polyps ablated with the tip of the  snare, large pedunculated polyp at the mid sigmoid colon resected, left-sided diverticula, polyps of the ascending colon and cecum removed. Adenomatous polyps. Large mid sigmoid polyp was tubulovillous adenoma.  . ESOPHAGOGASTRODUODENOSCOPY  04/30/2007   Dr. Gala Romney: severe ulcerative reflux esophagitis, localized area of adenomatous appearing firm raised sessile tissue distal esophagus suspicious for neoplasm. Large hiatal hernia. Biopsies from the esophagus were markedly abnormal but could not diagnose cancer.  . EUS  05/2007   pathology from paraesophageal lymph node and from esophagus were benign, cannot see EUS report  . KNEE SURGERY  4.2011   arthroscopic lavage, RIGHT knee for septic arthritis  . TONSILLECTOMY      Family History  Problem Relation Age of Onset  . Colon cancer Mother   .  Alzheimer's disease Mother   . Leukemia Father 53    Social History   Social History  . Marital status: Widowed    Spouse name: N/A  . Number of children: N/A  . Years of education: N/A   Occupational History  . retired Engineer, structural     Social History Main Topics  . Smoking status: Former Research scientist (life sciences)  . Smokeless tobacco: Never Used  . Alcohol use Yes     Comment: occasional with binge drinking  . Drug use: No  . Sexual activity: Yes   Other Topics Concern  . Not on file   Social History Narrative  . No narrative on file      ROS:  General: Negative for anorexia, weight loss, fever, chills, fatigue, weakness. Eyes: Negative for vision changes.  ENT: Negative for hoarseness, difficulty swallowing , nasal congestion. CV: Negative for chest pain, angina, palpitations, dyspnea on exertion, peripheral edema.  Respiratory: Negative for dyspnea at rest, dyspnea on exertion, cough, sputum, wheezing.  GI: See history of present illness. GU:  Negative for dysuria, hematuria, urinary incontinence, urinary frequency, nocturnal urination.  MS: Chronic knee pain. No low back pain  Derm: Negative for rash or itching.  Neuro: Negative for weakness, abnormal sensation, seizure, frequent headaches, memory loss, confusion.  Psych: Negative for depression, suicidal ideation, hallucinations. Positive anxiety Endo: Negative for unusual weight change.  Heme: Negative for bruising or bleeding. Allergy: Negative for rash or hives.    Physical Examination:  BP (!) 154/73   Pulse (!) 104   Temp (!) 96.7 F (35.9 C) (Oral)   Ht 5' 10.5" (1.791 m)   Wt 202 lb 3.2 oz (91.7 kg)   BMI 28.60 kg/m    General: Well-nourished, well-developed in no acute distress.  Head: Normocephalic, atraumatic.   Eyes: Conjunctiva pink, no icterus. Mouth: Oropharyngeal mucosa moist and pink , no lesions erythema or exudate. Neck: Supple without thyromegaly, masses, or lymphadenopathy.   Lungs: Clear to auscultation bilaterally.  Heart: Regular rate and rhythm, no murmurs rubs or gallops.  Abdomen: Bowel sounds are normal, nontender, nondistended, no hepatosplenomegaly or masses, no abdominal bruits or    hernia , no rebound or guarding.   Rectal: Deferred Extremities: No lower extremity edema. No clubbing or deformities.  Neuro: Alert and oriented x 4 , grossly normal neurologically.  Skin: Warm and dry, no rash or jaundice.   Psych: Alert and cooperative, normal mood and affect.  Labs: Lab Results  Component Value Date   CREATININE 1.09 11/28/2016   BUN 18 11/28/2016   NA 137 11/28/2016   K 4.2 11/28/2016   CL 106 11/28/2016   CO2 20 11/28/2016   Lab Results  Component Value Date  ALT 11 11/28/2016   AST 17 11/28/2016   ALKPHOS 85 11/28/2016   BILITOT 0.5 11/28/2016   Lab Results  Component Value Date   WBC 8.2 12/05/2016   HGB 9.3 (L) 12/05/2016   HCT 29.7 (L) 12/05/2016   MCV 78.6 (L) 12/05/2016   PLT 331 12/05/2016   Lab Results  Component Value Date   IRON 18 (L) 12/05/2016   TIBC 486 (H) 12/05/2016   FERRITIN 10 (L) 12/05/2016     Imaging Studies: No results found.

## 2017-02-19 ENCOUNTER — Telehealth: Payer: Self-pay | Admitting: Family Medicine

## 2017-02-19 MED ORDER — HYDROCODONE-ACETAMINOPHEN 5-325 MG PO TABS: ORAL_TABLET | ORAL | 0 refills | Status: DC

## 2017-02-19 NOTE — Telephone Encounter (Signed)
Ok to refill??  Last office visit 11/28/2016.  Last refill 01/21/2017.

## 2017-02-19 NOTE — Telephone Encounter (Signed)
Refill printed pt can pick up

## 2017-02-19 NOTE — Telephone Encounter (Signed)
Refill on hydrocodone

## 2017-02-20 NOTE — Telephone Encounter (Signed)
Call placed to patient and patient made aware per VM.  

## 2017-03-04 NOTE — Patient Instructions (Signed)
[name]  03/04/2017     @PREFPERIOPPHARMACY @   Your procedure is scheduled on  03/16/2017   Report to [facility] at  700  A.M.  Call this number if you have problems the morning of surgery:  [phone]   Remember:  Do not eat food or drink liquids after midnight.  Take these medicines the morning of surgery with A SIP OF WATER  Norvasc, hydrocodone, ativan, prilosec.   Do not wear jewelry, make-up or nail polish.  Do not wear lotions, powders, or perfumes, or deoderant.  Do not shave 48 hours prior to surgery.  Men may shave face and neck.  Do not bring valuables to the hospital.  [facility] is not responsible for any belongings or valuables.  Contacts, dentures or bridgework may not be worn into surgery.  Leave your suitcase in the car.  After surgery it may be brought to your room.  For patients admitted to the hospital, discharge time will be determined by your treatment team.  Patients discharged the day of surgery will not be allowed to drive home.   Name and phone number of your driver:   family Special instructions:  Follow the diet and prep instructions given to you by Dr Roseanne Kaufman office.  Please read over the following fact sheets that you were given. Anesthesia Post-op Instructions and Care and Recovery After Surgery       Esophagogastroduodenoscopy Esophagogastroduodenoscopy (EGD) is a procedure to examine the lining of the esophagus, stomach, and first part of the small intestine (duodenum). This procedure is done to check for problems such as inflammation, bleeding, ulcers, or growths. During this procedure, a long, flexible, lighted tube with a camera attached (endoscope) is inserted down the throat. Tell a health care provider about:  Any allergies you have.  All medicines you are taking, including vitamins, herbs, eye drops, creams, and over-the-counter medicines.  Any problems you or family members have had with  anesthetic medicines.  Any blood disorders you have.  Any surgeries you have had.  Any medical conditions you have.  Whether you are pregnant or may be pregnant. What are the risks? Generally, this is a safe procedure. However, problems may occur, including:  Infection.  Bleeding.  A tear (perforation) in the esophagus, stomach, or duodenum.  Trouble breathing.  Excessive sweating.  Spasms of the larynx.  A slowed heartbeat.  Low blood pressure.  What happens before the procedure?  Follow instructions from your health care provider about eating or drinking restrictions.  Ask your health care provider about: ? Changing or stopping your regular medicines. This is especially important if you are taking diabetes medicines or blood thinners. ? Taking medicines such as aspirin and ibuprofen. These medicines can thin your blood. Do not take these medicines before your procedure if your health care provider instructs you not to.  Plan to have someone take you home after the procedure.  If you wear dentures, be ready to remove them before the procedure. What happens during the procedure?  To reduce your risk of infection, your health care team will wash or sanitize their hands.  An IV tube will be put in a vein in your hand or arm. You will get medicines and fluids through this tube.  You will be given one or more of the following: ? A medicine to help you relax (sedative). ? A medicine to numb the area (local  anesthetic). This medicine may be sprayed into your throat. It will make you feel more comfortable and keep you from gagging or coughing during the procedure. ? A medicine for pain.  A mouth guard may be placed in your mouth to protect your teeth and to keep you from biting on the endoscope.  You will be asked to lie on your left side.  The endoscope will be lowered down your throat into your esophagus, stomach, and duodenum.  Air will be put into the endoscope.  This will help your health care provider see better.  The lining of your esophagus, stomach, and duodenum will be examined.  Your health care provider may: ? Take a tissue sample so it can be looked at in a lab (biopsy). ? Remove growths. ? Remove objects (foreign bodies) that are stuck. ? Treat any bleeding with medicines or other devices that stop tissue from bleeding. ? Widen (dilate) or stretch narrowed areas of your esophagus and stomach.  The endoscope will be taken out. The procedure may vary among health care providers and hospitals. What happens after the procedure?  Your blood pressure, heart rate, breathing rate, and blood oxygen level will be monitored often until the medicines you were given have worn off.  Do not eat or drink anything until the numbing medicine has worn off and your gag reflex has returned. This information is not intended to replace advice given to you by your health care provider. Make sure you discuss any questions you have with your health care provider. Document Released: 09/19/2004 Document Revised: 10/25/2015 Document Reviewed: 04/12/2015 Elsevier Interactive Patient Education  2018 [employer]. Esophagogastroduodenoscopy, Care After Refer to this sheet in the next few weeks. These instructions provide you with information about caring for yourself after your procedure. Your health care provider may also give you more specific instructions. Your treatment has been planned according to current medical practices, but problems sometimes occur. Call your health care provider if you have any problems or questions after your procedure. What can I expect after the procedure? After the procedure, it is common to have:  A sore throat.  Nausea.  Bloating.  Dizziness.  Fatigue.  Follow these instructions at home:  Do not eat or drink anything until the numbing medicine (local anesthetic) has worn off and your gag reflex has returned. You will know  that the local anesthetic has worn off when you can swallow comfortably.  Do not drive for 24 hours if you received a medicine to help you relax (sedative).  If your health care provider took a tissue sample for testing during the procedure, make sure to get your test results. This is your responsibility. Ask your health care provider or the department performing the test when your results will be ready.  Keep all follow-up visits as told by your health care provider. This is important. Contact a health care provider if:  You cannot stop coughing.  You are not urinating.  You are urinating less than usual. Get help right away if:  You have trouble swallowing.  You cannot eat or drink.  You have throat or chest pain that gets worse.  You are dizzy or light-headed.  You faint.  You have nausea or vomiting.  You have chills.  You have a fever.  You have severe abdominal pain.  You have black, tarry, or bloody stools. This information is not intended to replace advice given to you by your health care provider. Make sure you discuss any  questions you have with your health care provider. Document Released: 05/05/2012 Document Revised: 10/25/2015 Document Reviewed: 04/12/2015 Elsevier Interactive Patient Education  2018 [employer].  Colonoscopy, Adult A colonoscopy is an exam to look at the entire large intestine. During the exam, a lubricated, bendable tube is inserted into the anus and then passed into the rectum, colon, and other parts of the large intestine. A colonoscopy is often done as a part of normal colorectal screening or in response to certain symptoms, such as anemia, persistent diarrhea, abdominal pain, and blood in the stool. The exam can help screen for and diagnose medical problems, including:  Tumors.  Polyps.  Inflammation.  Areas of bleeding.  Tell a health care provider about:  Any allergies you have.  All medicines you are taking, including  vitamins, herbs, eye drops, creams, and over-the-counter medicines.  Any problems you or family members have had with anesthetic medicines.  Any blood disorders you have.  Any surgeries you have had.  Any medical conditions you have.  Any problems you have had passing stool. What are the risks? Generally, this is a safe procedure. However, problems may occur, including:  Bleeding.  A tear in the intestine.  A reaction to medicines given during the exam.  Infection (rare).  What happens before the procedure? Eating and drinking restrictions Follow instructions from your health care provider about eating and drinking, which may include:  A few days before the procedure - follow a low-fiber diet. Avoid nuts, seeds, dried fruit, raw fruits, and vegetables.  1-3 days before the procedure - follow a clear liquid diet. Drink only clear liquids, such as clear broth or bouillon, black coffee or tea, clear juice, clear soft drinks or sports drinks, gelatin dessert, and popsicles. Avoid any liquids that contain red or purple dye.  On the day of the procedure - do not eat or drink anything during the 2 hours before the procedure, or within the time period that your health care provider recommends.  Bowel prep If you were prescribed an oral bowel prep to clean out your colon:  Take it as told by your health care provider. Starting the day before your procedure, you will need to drink a large amount of medicated liquid. The liquid will cause you to have multiple loose stools until your stool is almost clear or light green.  If your skin or anus gets irritated from diarrhea, you may use these to relieve the irritation: ? Medicated wipes, such as adult wet wipes with aloe and vitamin E. ? A skin soothing-product like petroleum jelly.  If you vomit while drinking the bowel prep, take a break for up to 60 minutes and then begin the bowel prep again. If vomiting continues and you cannot take  the bowel prep without vomiting, call your health care provider.  General instructions  Ask your health care provider about changing or stopping your regular medicines. This is especially important if you are taking diabetes medicines or blood thinners.  Plan to have someone take you home from the hospital or clinic. What happens during the procedure?  An IV tube may be inserted into one of your veins.  You will be given medicine to help you relax (sedative).  To reduce your risk of infection: ? Your health care team will wash or sanitize their hands. ? Your anal area will be washed with soap.  You will be asked to lie on your side with your knees bent.  Your health care provider  will lubricate a long, thin, flexible tube. The tube will have a camera and a light on the end.  The tube will be inserted into your anus.  The tube will be gently eased through your rectum and colon.  Air will be delivered into your colon to keep it open. You may feel some pressure or cramping.  The camera will be used to take images during the procedure.  A small tissue sample may be removed from your body to be examined under a microscope (biopsy). If any potential problems are found, the tissue will be sent to a lab for testing.  If small polyps are found, your health care provider may remove them and have them checked for cancer cells.  The tube that was inserted into your anus will be slowly removed. The procedure may vary among health care providers and hospitals. What happens after the procedure?  Your blood pressure, heart rate, breathing rate, and blood oxygen level will be monitored until the medicines you were given have worn off.  Do not drive for 24 hours after the exam.  You may have a small amount of blood in your stool.  You may pass gas and have mild abdominal cramping or bloating due to the air that was used to inflate your colon during the exam.  It is up to you to get the  results of your procedure. Ask your health care provider, or the department performing the procedure, when your results will be ready. This information is not intended to replace advice given to you by your health care provider. Make sure you discuss any questions you have with your health care provider. Document Released: 05/16/2000 Document Revised: 03/19/2016 Document Reviewed: 07/31/2015 Elsevier Interactive Patient Education  2018 [employer].  Colonoscopy, Adult, Care After This sheet gives you information about how to care for yourself after your procedure. Your health care provider may also give you more specific instructions. If you have problems or questions, contact your health care provider. What can I expect after the procedure? After the procedure, it is common to have:  A small amount of blood in your stool for 24 hours after the procedure.  Some gas.  Mild abdominal cramping or bloating.  Follow these instructions at home: General instructions   For the first 24 hours after the procedure: ? Do not drive or use machinery. ? Do not sign important documents. ? Do not drink alcohol. ? Do your regular daily activities at a slower pace than normal. ? Eat soft, easy-to-digest foods. ? Rest often.  Take over-the-counter or prescription medicines only as told by your health care provider.  It is up to you to get the results of your procedure. Ask your health care provider, or the department performing the procedure, when your results will be ready. Relieving cramping and bloating  Try walking around when you have cramps or feel bloated.  Apply heat to your abdomen as told by your health care provider. Use a heat source that your health care provider recommends, such as a moist heat pack or a heating pad. ? Place a towel between your skin and the heat source. ? Leave the heat on for 20-30 minutes. ? Remove the heat if your skin turns bright red. This is especially  important if you are unable to feel pain, heat, or cold. You may have a greater risk of getting burned. Eating and drinking  Drink enough fluid to keep your urine clear or pale yellow.  Resume  your normal diet as instructed by your health care provider. Avoid heavy or fried foods that are hard to digest.  Avoid drinking alcohol for as long as instructed by your health care provider. Contact a health care provider if:  You have blood in your stool 2-3 days after the procedure. Get help right away if:  You have more than a small spotting of blood in your stool.  You pass large blood clots in your stool.  Your abdomen is swollen.  You have nausea or vomiting.  You have a fever.  You have increasing abdominal pain that is not relieved with medicine. This information is not intended to replace advice given to you by your health care provider. Make sure you discuss any questions you have with your health care provider. Document Released: 01/01/2004 Document Revised: 02/11/2016 Document Reviewed: 07/31/2015 Elsevier Interactive Patient Education  2018 [place] Anesthesia is a term that refers to techniques, procedures, and medicines that help a person stay safe and comfortable during a medical procedure. Monitored anesthesia care, or sedation, is one type of anesthesia. Your anesthesia specialist may recommend sedation if you will be having a procedure that does not require you to be unconscious, such as:  Cataract surgery.  A dental procedure.  A biopsy.  A colonoscopy.  During the procedure, you may receive a medicine to help you relax (sedative). There are three levels of sedation:  Mild sedation. At this level, you may feel awake and relaxed. You will be able to follow directions.  Moderate sedation. At this level, you will be sleepy. You may not remember the procedure.  Deep sedation. At this level, you will be asleep. You will not remember  the procedure.  The more medicine you are given, the deeper your level of sedation will be. Depending on how you respond to the procedure, the anesthesia specialist may change your level of sedation or the type of anesthesia to fit your needs. An anesthesia specialist will monitor you closely during the procedure. Let your health care provider know about:  Any allergies you have.  All medicines you are taking, including vitamins, herbs, eye drops, creams, and over-the-counter medicines.  Any use of steroids (by mouth or as a cream).  Any problems you or family members have had with sedatives and anesthetic medicines.  Any blood disorders you have.  Any surgeries you have had.  Any medical conditions you have, such as sleep apnea.  Whether you are pregnant or may be pregnant.  Any use of cigarettes, alcohol, or street drugs. What are the risks? Generally, this is a safe procedure. However, problems may occur, including:  Getting too much medicine (oversedation).  Nausea.  Allergic reaction to medicines.  Trouble breathing. If this happens, a breathing tube may be used to help with breathing. It will be removed when you are awake and breathing on your own.  Heart trouble.  Lung trouble.  Before the procedure Staying hydrated Follow instructions from your health care provider about hydration, which may include:  Up to 2 hours before the procedure - you may continue to drink clear liquids, such as water, clear fruit juice, black coffee, and plain tea.  Eating and drinking restrictions Follow instructions from your health care provider about eating and drinking, which may include:  8 hours before the procedure - stop eating heavy meals or foods such as meat, fried foods, or fatty foods.  6 hours before the procedure - stop eating light meals  or foods, such as toast or cereal.  6 hours before the procedure - stop drinking milk or drinks that contain milk.  2 hours before  the procedure - stop drinking clear liquids.  Medicines Ask your health care provider about:  Changing or stopping your regular medicines. This is especially important if you are taking diabetes medicines or blood thinners.  Taking medicines such as aspirin and ibuprofen. These medicines can thin your blood. Do not take these medicines before your procedure if your health care provider instructs you not to.  Tests and exams  You will have a physical exam.  You may have blood tests done to show: ? How well your kidneys and liver are working. ? How well your blood can clot.  General instructions  Plan to have someone take you home from the hospital or clinic.  If you will be going home right after the procedure, plan to have someone with you for 24 hours.  What happens during the procedure?  Your blood pressure, heart rate, breathing, level of pain and overall condition will be monitored.  An IV tube will be inserted into one of your veins.  Your anesthesia specialist will give you medicines as needed to keep you comfortable during the procedure. This may mean changing the level of sedation.  The procedure will be performed. After the procedure  Your blood pressure, heart rate, breathing rate, and blood oxygen level will be monitored until the medicines you were given have worn off.  Do not drive for 24 hours if you received a sedative.  You may: ? Feel sleepy, clumsy, or nauseous. ? Feel forgetful about what happened after the procedure. ? Have a sore throat if you had a breathing tube during the procedure. ? Vomit. This information is not intended to replace advice given to you by your health care provider. Make sure you discuss any questions you have with your health care provider. Document Released: 02/12/2005 Document Revised: 10/26/2015 Document Reviewed: 09/09/2015 Elsevier Interactive Patient Education  2018 [place], Care  After These instructions provide you with information about caring for yourself after your procedure. Your health care provider may also give you more specific instructions. Your treatment has been planned according to current medical practices, but problems sometimes occur. Call your health care provider if you have any problems or questions after your procedure. What can I expect after the procedure? After your procedure, it is common to:  Feel sleepy for several hours.  Feel clumsy and have poor balance for several hours.  Feel forgetful about what happened after the procedure.  Have poor judgment for several hours.  Feel nauseous or vomit.  Have a sore throat if you had a breathing tube during the procedure.  Follow these instructions at home: For at least 24 hours after the procedure:   Do not: ? Participate in activities in which you could fall or become injured. ? Drive. ? Use heavy machinery. ? Drink alcohol. ? Take sleeping pills or medicines that cause drowsiness. ? Make important decisions or sign legal documents. ? Take care of children on your own.  Rest. Eating and drinking  Follow the diet that is recommended by your health care provider.  If you vomit, drink water, juice, or soup when you can drink without vomiting.  Make sure you have little or no nausea before eating solid foods. General instructions  Have a responsible adult stay with you until you are awake and alert.  Take  over-the-counter and prescription medicines only as told by your health care provider.  If you smoke, do not smoke without supervision.  Keep all follow-up visits as told by your health care provider. This is important. Contact a health care provider if:  You keep feeling nauseous or you keep vomiting.  You feel light-headed.  You develop a rash.  You have a fever. Get help right away if:  You have trouble breathing. This information is not intended to replace advice  given to you by your health care provider. Make sure you discuss any questions you have with your health care provider. Document Released: 09/09/2015 Document Revised: 01/09/2016 Document Reviewed: 09/09/2015 Elsevier Interactive Patient Education  [employer].

## 2017-03-05 ENCOUNTER — Other Ambulatory Visit: Payer: Self-pay | Admitting: Family Medicine

## 2017-03-10 ENCOUNTER — Encounter (HOSPITAL_COMMUNITY)
Admission: RE | Admit: 2017-03-10 | Discharge: 2017-03-10 | Disposition: A | Discharge status: Home / Self Care | Payer: Medicare Other | Source: Ambulatory Visit | Attending: Internal Medicine | Admitting: Internal Medicine

## 2017-03-10 ENCOUNTER — Encounter (HOSPITAL_COMMUNITY): Payer: Self-pay

## 2017-03-10 ENCOUNTER — Other Ambulatory Visit: Payer: Self-pay | Admitting: Family Medicine

## 2017-03-10 DIAGNOSIS — Z01818 Encounter for other preprocedural examination: Secondary | ICD-10-CM | POA: Diagnosis not present

## 2017-03-10 DIAGNOSIS — I4430 Unspecified atrioventricular block: Secondary | ICD-10-CM | POA: Insufficient documentation

## 2017-03-10 HISTORY — DX: Unspecified hearing loss, unspecified ear: H91.90

## 2017-03-10 HISTORY — DX: Personal history of urinary calculi: Z87.442

## 2017-03-10 LAB — BASIC METABOLIC PANEL
Anion gap: 10 (ref 5–15)
BUN: 15 mg/dL (ref 6–20)
CHLORIDE: 101 mmol/L (ref 101–111)
CO2: 25 mmol/L (ref 22–32)
CREATININE: 0.87 mg/dL (ref 0.61–1.24)
Calcium: 8.9 mg/dL (ref 8.9–10.3)
GFR calc non Af Amer: >60 mL/min (ref >60)
Glucose, Bld: 103 mg/dL — ABNORMAL HIGH (ref 65–99)
Potassium: 3.9 mmol/L (ref 3.5–5.1)
SODIUM: 136 mmol/L (ref 135–145)

## 2017-03-10 LAB — CBC
HCT: 39.3 % (ref 39.0–52.0)
HEMOGLOBIN: 12.3 g/dL — AB (ref 13.0–17.0)
MCH: 27.2 pg (ref 26.0–34.0)
MCHC: 31.3 g/dL (ref 30.0–36.0)
MCV: 86.9 fL (ref 78.0–100.0)
Platelets: 215 10*3/uL (ref 150–400)
RBC: 4.52 MIL/uL (ref 4.22–5.81)
RDW: 17.1 % — ABNORMAL HIGH (ref 11.5–15.5)
WBC: 6.8 10*3/uL (ref 4.0–10.5)

## 2017-03-10 LAB — SURGICAL PCR SCREEN
MRSA, PCR: NEGATIVE
STAPHYLOCOCCUS AUREUS: NEGATIVE

## 2017-03-10 NOTE — Telephone Encounter (Signed)
Okay to refill? 

## 2017-03-10 NOTE — Telephone Encounter (Signed)
Medication called to pharmacy. 

## 2017-03-10 NOTE — Telephone Encounter (Signed)
Ok to refill??  Last office visit 11/28/2016.  Last refill 12/18/2016, #2 refills.

## 2017-03-12 ENCOUNTER — Other Ambulatory Visit: Payer: Self-pay | Admitting: Family Medicine

## 2017-03-14 ENCOUNTER — Other Ambulatory Visit: Payer: Self-pay | Admitting: Family Medicine

## 2017-03-16 ENCOUNTER — Encounter (HOSPITAL_COMMUNITY)
Admission: RE | Disposition: A | Discharge status: Home / Self Care | Payer: Self-pay | Source: Ambulatory Visit | Attending: Internal Medicine

## 2017-03-16 ENCOUNTER — Ambulatory Visit (HOSPITAL_COMMUNITY): Payer: Medicare Other | Admitting: Anesthesiology

## 2017-03-16 ENCOUNTER — Encounter (HOSPITAL_COMMUNITY): Payer: Self-pay

## 2017-03-16 ENCOUNTER — Ambulatory Visit (HOSPITAL_COMMUNITY)
Admission: RE | Admit: 2017-03-16 | Discharge: 2017-03-16 | Disposition: A | Discharge status: Home / Self Care | Payer: Medicare Other | Source: Ambulatory Visit | Attending: Internal Medicine | Admitting: Internal Medicine

## 2017-03-16 DIAGNOSIS — D509 Iron deficiency anemia, unspecified: Secondary | ICD-10-CM

## 2017-03-16 DIAGNOSIS — Z87891 Personal history of nicotine dependence: Secondary | ICD-10-CM | POA: Diagnosis not present

## 2017-03-16 DIAGNOSIS — Z7982 Long term (current) use of aspirin: Secondary | ICD-10-CM | POA: Diagnosis not present

## 2017-03-16 DIAGNOSIS — Z86010 Personal history of colonic polyps: Secondary | ICD-10-CM | POA: Insufficient documentation

## 2017-03-16 DIAGNOSIS — K2950 Unspecified chronic gastritis without bleeding: Secondary | ICD-10-CM | POA: Insufficient documentation

## 2017-03-16 DIAGNOSIS — Q438 Other specified congenital malformations of intestine: Secondary | ICD-10-CM | POA: Diagnosis not present

## 2017-03-16 DIAGNOSIS — Z79899 Other long term (current) drug therapy: Secondary | ICD-10-CM | POA: Diagnosis not present

## 2017-03-16 DIAGNOSIS — K3189 Other diseases of stomach and duodenum: Secondary | ICD-10-CM

## 2017-03-16 DIAGNOSIS — K449 Diaphragmatic hernia without obstruction or gangrene: Secondary | ICD-10-CM | POA: Insufficient documentation

## 2017-03-16 DIAGNOSIS — K219 Gastro-esophageal reflux disease without esophagitis: Secondary | ICD-10-CM | POA: Insufficient documentation

## 2017-03-16 DIAGNOSIS — K2270 Barrett's esophagus without dysplasia: Secondary | ICD-10-CM | POA: Insufficient documentation

## 2017-03-16 DIAGNOSIS — R195 Other fecal abnormalities: Secondary | ICD-10-CM | POA: Diagnosis not present

## 2017-03-16 DIAGNOSIS — K5730 Diverticulosis of large intestine without perforation or abscess without bleeding: Secondary | ICD-10-CM | POA: Diagnosis not present

## 2017-03-16 DIAGNOSIS — D500 Iron deficiency anemia secondary to blood loss (chronic): Secondary | ICD-10-CM | POA: Diagnosis not present

## 2017-03-16 DIAGNOSIS — Z8614 Personal history of Methicillin resistant Staphylococcus aureus infection: Secondary | ICD-10-CM | POA: Insufficient documentation

## 2017-03-16 DIAGNOSIS — K921 Melena: Secondary | ICD-10-CM | POA: Diagnosis not present

## 2017-03-16 DIAGNOSIS — I10 Essential (primary) hypertension: Secondary | ICD-10-CM | POA: Insufficient documentation

## 2017-03-16 HISTORY — PX: COLONOSCOPY WITH PROPOFOL: SHX5780

## 2017-03-16 HISTORY — PX: BIOPSY: SHX5522

## 2017-03-16 HISTORY — PX: ESOPHAGOGASTRODUODENOSCOPY (EGD) WITH PROPOFOL: SHX5813

## 2017-03-16 SURGERY — COLONOSCOPY WITH PROPOFOL
Anesthesia: Monitor Anesthesia Care

## 2017-03-16 MED ORDER — LACTATED RINGERS IV SOLN
INTRAVENOUS | Status: DC
  Administered 2017-03-16: 08:00:00 via INTRAVENOUS

## 2017-03-16 MED ORDER — LIDOCAINE VISCOUS 2 % MT SOLN
15.0000 mL | Freq: Once | OROMUCOSAL | Status: AC
  Administered 2017-03-16: 15 mL via OROMUCOSAL

## 2017-03-16 MED ORDER — FENTANYL CITRATE (PF) 100 MCG/2ML IJ SOLN
INTRAMUSCULAR | Status: AC
  Filled 2017-03-16: qty 2

## 2017-03-16 MED ORDER — LIDOCAINE VISCOUS 2 % MT SOLN
OROMUCOSAL | Status: AC
  Filled 2017-03-16: qty 15

## 2017-03-16 MED ORDER — CHLORHEXIDINE GLUCONATE CLOTH 2 % EX PADS: 6.0000 | MEDICATED_PAD | Freq: Once | CUTANEOUS | Status: DC

## 2017-03-16 MED ORDER — LIDOCAINE HCL (PF) 1 % IJ SOLN
INTRAMUSCULAR | Status: AC
  Filled 2017-03-16: qty 5

## 2017-03-16 MED ORDER — MIDAZOLAM HCL 2 MG/2ML IJ SOLN
INTRAMUSCULAR | Status: AC
  Filled 2017-03-16: qty 2

## 2017-03-16 MED ORDER — PROPOFOL 10 MG/ML IV BOLUS
INTRAVENOUS | Status: AC
  Filled 2017-03-16: qty 40

## 2017-03-16 MED ORDER — MIDAZOLAM HCL 2 MG/2ML IJ SOLN
1.0000 mg | INTRAMUSCULAR | Status: AC
  Administered 2017-03-16: 2 mg via INTRAVENOUS

## 2017-03-16 MED ORDER — FENTANYL CITRATE (PF) 100 MCG/2ML IJ SOLN
25.0000 ug | Freq: Once | INTRAMUSCULAR | Status: AC
  Administered 2017-03-16: 25 ug via INTRAVENOUS

## 2017-03-16 MED ORDER — PROPOFOL 500 MG/50ML IV EMUL
INTRAVENOUS | Status: DC | PRN
  Administered 2017-03-16: 150 ug/kg/min via INTRAVENOUS
  Administered 2017-03-16 (×2): via INTRAVENOUS

## 2017-03-16 MED ORDER — PROPOFOL 10 MG/ML IV BOLUS
INTRAVENOUS | Status: DC | PRN
  Administered 2017-03-16 (×2): 10 mg via INTRAVENOUS

## 2017-03-16 NOTE — Anesthesia Postprocedure Evaluation (Signed)
Anesthesia Post Note  Patient: [name]  Procedure(s) Performed: COLONOSCOPY WITH PROPOFOL (N/A ) ESOPHAGOGASTRODUODENOSCOPY (EGD) WITH PROPOFOL (N/A ) BIOPSY  Patient location during evaluation: PACU Anesthesia Type: MAC Level of consciousness: awake and alert and oriented Pain management: pain level controlled Respiratory status: spontaneous breathing Cardiovascular status: blood pressure returned to baseline Postop Assessment: no apparent nausea or vomiting Anesthetic complications: no Comments: Late entry     Last Vitals:  Vitals:   03/16/17 0945 03/16/17 0950  BP: 119/65 129/75  Pulse: 84 89  Resp: 13 14  Temp:  36.7 C  SpO2: 97% 94%    Last Pain:  Vitals:   03/16/17 0950  TempSrc: Oral                 Journey Castonguay

## 2017-03-16 NOTE — Transfer of Care (Signed)
Immediate Anesthesia Transfer of Care Note  Patient: [name]  Procedure(s) Performed: COLONOSCOPY WITH PROPOFOL (N/A ) ESOPHAGOGASTRODUODENOSCOPY (EGD) WITH PROPOFOL (N/A ) BIOPSY  Patient Location: PACU  Anesthesia Type:MAC  Level of Consciousness: awake  Airway & Oxygen Therapy: Patient Spontanous Breathing  Post-op Assessment: Report given to RN and Post -op Vital signs reviewed and stable  Post vital signs: Reviewed and stable  Last Vitals:  Vitals:   03/16/17 0710  BP: (!) 150/85  Pulse: (!) 111  Temp: 36.8 C  SpO2: 95%    Last Pain:  Vitals:   03/16/17 0710  TempSrc: Oral         Complications: No apparent anesthesia complications

## 2017-03-16 NOTE — Op Note (Signed)
[facility] Patient Name: [name] Procedure Date: 03/16/2017 9:02 AM MRN: [id number] Date of Birth: [birth year] Attending MD: Norvel Richards , MD CSN: [id number] Age: 83 Admit Type: Outpatient Procedure:                Upper GI endoscopy Indications:              Iron deficiency anemia secondary to chronic blood                            loss Providers:                Norvel Richards, MD, Gwenlyn Fudge RN, RN,                            Janeece Riggers, RN Referring MD:             Zella Richer. [place], MD Medicines:                Propofol per Anesthesia Complications:            No immediate complications. Estimated Blood Loss:     Estimated blood loss was minimal. Procedure:                Pre-Anesthesia Assessment:                           - Prior to the procedure, a History and Physical                            was performed, and patient medications and                            allergies were reviewed. The patient's tolerance of                            previous anesthesia was also reviewed. The risks                            and benefits of the procedure and the sedation                            options and risks were discussed with the patient.                            All questions were answered, and informed consent                            was obtained. Prior Anticoagulants: The patient has                            taken no previous anticoagulant or antiplatelet                            agents. ASA Grade Assessment: III - A patient with  severe systemic disease. After reviewing the risks                            and benefits, the patient was deemed in                            satisfactory condition to undergo the procedure.                           After obtaining informed consent, the endoscope was                            passed under direct vision. Throughout the                            procedure, the  patient's blood pressure, pulse, and                            oxygen saturations were monitored continuously. The                            EG-[id number] ([id number]) scope was introduced through the                            and advanced to the second part of duodenum. The                            upper GI endoscopy was accomplished without                            difficulty. The patient tolerated the procedure                            well. Scope In: 9:07:34 AM Scope Out: 9:14:54 AM Total Procedure Duration: 0 hours 7 minutes 20 seconds  Findings:      salmon-colored epithelium coming up 4 cm above the GE junction. No       esophagitis. No nodularity.. This was biopsied with a cold forceps for       histology. Estimated blood loss was minimal.      A medium-sized hiatal hernia was present.      Multiple erosions were found in the gastric antrum. bnormal mucosa       biopsy.      The duodenal bulb and second portion of the duodenum were normal. Impression:               - Barrett's esophagus. Biopsied.                           - Medium-sized hiatal hernia.                           - Erosive gastropathy. biopsied                           - Normal duodenal bulb and second portion of the  duodenum. Moderate Sedation:      Moderate (conscious) sedation was personally administered by an       anesthesia professional. The following parameters were monitored: oxygen       saturation, heart rate, blood pressure, respiratory rate, EKG, adequacy       of pulmonary ventilation, and response to care. Total physician       intraservice time was 49 minutes. Recommendation:           - Patient has a contact number available for                            emergencies. The signs and symptoms of potential                            delayed complications were discussed with the                            patient. Return to normal activities tomorrow.                             Written discharge instructions were provided to the                            patient.                           - Resume previous diet.                           - Continue present medications. Continue omeprazole                            20 mg daily.                           - Repeat upper endoscopy after studies are complete                            for surveillance based on pathology results.                           - Return to GI clinic in 3 months. `see colonoscopy                            report. Procedure Code(s):        --- Professional ---                           [phone], Esophagogastroduodenoscopy, flexible,                            transoral; with biopsy, single or multiple Diagnosis Code(s):        --- Professional ---                           K22.70, Barrett's esophagus without dysplasia  K44.9, Diaphragmatic hernia without obstruction or                            gangrene                           K31.89, Other diseases of stomach and duodenum CPT copyright 2016 American Medical Association. All rights reserved. The codes documented in this report are preliminary and upon coder review may  be revised to meet current compliance requirements. Cristopher Estimable. Leolia Vinzant, MD Norvel Richards, MD 03/16/2017 9:28:02 AM This report has been signed electronically. Number of Addenda: 0

## 2017-03-16 NOTE — Op Note (Signed)
[facility] Patient Name: [name] Procedure Date: 03/16/2017 8:08 AM MRN: [id number] Date of Birth: 1927-10-30 Attending MD: Norvel Richards , MD CSN: [id number] Age: 83 Admit Type: Outpatient Procedure:                Colonoscopy Indications:              Heme positive stool Providers:                Norvel Richards, MD, Otis Peak B. Sharon Seller, RN,                            Janeece Riggers, RN Referring MD:              Medicines:                Propofol per Anesthesia Complications:            No immediate complications. Estimated Blood Loss:     Estimated blood loss: none. Procedure:                Pre-Anesthesia Assessment:                           - Prior to the procedure, a History and Physical                            was performed, and patient medications and                            allergies were reviewed. The patient's tolerance of                            previous anesthesia was also reviewed. The risks                            and benefits of the procedure and the sedation                            options and risks were discussed with the patient.                            All questions were answered, and informed consent                            was obtained. Prior Anticoagulants: The patient has                            taken no previous anticoagulant or antiplatelet                            agents. ASA Grade Assessment: III - A patient with                            severe systemic disease. After reviewing the risks  and benefits, the patient was deemed in                            satisfactory condition to undergo the procedure.                           After obtaining informed consent, the colonoscope                            was passed under direct vision. Throughout the                            procedure, the patient's blood pressure, pulse, and                            oxygen saturations were  monitored continuously. The                            EC-[id number] ([id number]) scope was introduced through                            the anus and advanced to the the cecum, identified                            by appendiceal orifice and ileocecal valve. The                            EC-[id number] ([id number]) scope was introduced through                            the and advanced to the. The ileocecal valve,                            appendiceal orifice, and rectum were photographed.                            The entire colon was well visualized. The                            colonoscopy was performed without difficulty. The                            patient tolerated the procedure well. The quality                            of the bowel preparation was adequate. The quality                            of the bowel preparation was adequate. Scope In: 8:32:17 AM Scope Out: 9:00:30 AM Scope Withdrawal Time: 0 hours 6 minutes 30 seconds  Total Procedure Duration: 0 hours 28 minutes 13 seconds  Findings:      The perianal and digital rectal examinations were normal.      Scattered small-mouthed diverticula were found in  the sigmoid colon,       descending colon and ascending colon. Redundant colon. Made it to the       ascending colon with an adult scope saw the valve in the distance. Loss       CO2 insufflator function. Had to remove the scope obtain a new scope and       advanced to the cecum. Turning of the patient and external abdominal       pressure required to reach the cecum.      The exam was otherwise without abnormality on direct and retroflexion       views. Impression:               - Diverticulosis in the sigmoid colon, in the                            descending colon and in the ascending colon.                            Redundant colon.                           - The examination was otherwise normal on direct                            and retroflexion views.                            - No specimens collected. Moderate Sedation:      Moderate (conscious) sedation was personally administered by an       anesthesia professional. The following parameters were monitored: oxygen       saturation, heart rate, blood pressure, respiratory rate, EKG, adequacy       of pulmonary ventilation, and response to care. Total physician       intraservice time was 45 minutes. Recommendation:           - Patient has a contact number available for                            emergencies. The signs and symptoms of potential                            delayed complications were discussed with the                            patient. Return to normal activities tomorrow.                            Written discharge instructions were provided to the                            patient.                           - Resume previous diet.                           - Continue present  medications.                           - No repeat colonoscopy due to age.                           - Return to GI clinic in 3 months. See EGD report.                            Patient may need further evaluation of the small                            bowel. Procedure Code(s):        --- Professional ---                           [phone], Colonoscopy, flexible; diagnostic, including                            collection of specimen(s) by brushing or washing,                            when performed (separate procedure) Diagnosis Code(s):        --- Professional ---                           R19.5, Other fecal abnormalities                           K57.30, Diverticulosis of large intestine without                            perforation or abscess without bleeding CPT copyright 2016 American Medical Association. All rights reserved. The codes documented in this report are preliminary and upon coder review may  be revised to meet current compliance requirements. Cristopher Estimable. Anupama Piehl, MD Norvel Richards,  MD 03/16/2017 9:50:08 AM This report has been signed electronically. Number of Addenda: 0

## 2017-03-16 NOTE — Anesthesia Preprocedure Evaluation (Signed)
Anesthesia Evaluation  Patient identified by MRN, date of birth, ID band Patient awake    Reviewed: Allergy & Precautions, NPO status , Patient's Chart, lab work & pertinent test results  Airway Mallampati: III  TM Distance: >3 FB Neck ROM: Full    Dental  (+) Edentulous Upper, Poor Dentition, Missing, Chipped, Dental Advisory Given   Pulmonary former smoker,    breath sounds clear to auscultation       Cardiovascular hypertension, Pt. on medications  Rhythm:Regular Rate:Normal     Neuro/Psych PSYCHIATRIC DISORDERS Anxiety Depression    GI/Hepatic GERD  ,(+)     substance abuse  alcohol use, Alcoholism /alcohol abuse    Endo/Other    Renal/GU      Musculoskeletal   Abdominal   Peds  Hematology  (+) Blood dyscrasia ( Thrombocytopenia ), anemia ,   Anesthesia Other Findings   Reproductive/Obstetrics                             Anesthesia Physical Anesthesia Plan  ASA: III  Anesthesia Plan: MAC   Post-op Pain Management:    Induction: Intravenous  PONV Risk Score and Plan:   Airway Management Planned: Simple Face Mask  Additional Equipment:   Intra-op Plan:   Post-operative Plan:   Informed Consent: I have reviewed the patients History and Physical, chart, labs and discussed the procedure including the risks, benefits and alternatives for the proposed anesthesia with the patient or authorized representative who has indicated his/her understanding and acceptance.     Plan Discussed with:   Anesthesia Plan Comments:         Anesthesia Quick Evaluation

## 2017-03-16 NOTE — Discharge Instructions (Signed)
Colonoscopy Discharge Instructions  Read the instructions outlined below and refer to this sheet in the next few weeks. These discharge instructions provide you with general information on caring for yourself after you leave the hospital. Your doctor may also give you specific instructions. While your treatment has been planned according to the most current medical practices available, unavoidable complications occasionally occur. If you have any problems or questions after discharge, call Dr. Gala Romney at [phone]. ACTIVITY  You may resume your regular activity, but move at a slower pace for the next 24 hours.   Take frequent rest periods for the next 24 hours.   Walking will help get rid of the air and reduce the bloated feeling in your belly (abdomen).   No driving for 24 hours (because of the medicine (anesthesia) used during the test).    Do not sign any important legal documents or operate any machinery for 24 hours (because of the anesthesia used during the test).  NUTRITION  Drink plenty of fluids.   You may resume your normal diet as instructed by your doctor.   Begin with a light meal and progress to your normal diet. Heavy or fried foods are harder to digest and may make you feel sick to your stomach (nauseated).   Avoid alcoholic beverages for 24 hours or as instructed.  MEDICATIONS  You may resume your normal medications unless your doctor tells you otherwise.  WHAT YOU CAN EXPECT TODAY  Some feelings of bloating in the abdomen.   Passage of more gas than usual.   Spotting of blood in your stool or on the toilet paper.  IF YOU HAD POLYPS REMOVED DURING THE COLONOSCOPY:  No aspirin products for 7 days or as instructed.   No alcohol for 7 days or as instructed.   Eat a soft diet for the next 24 hours.  FINDING OUT THE RESULTS OF YOUR TEST Not all test results are available during your visit. If your test results are not back during the visit, make an appointment  with your caregiver to find out the results. Do not assume everything is normal if you have not heard from your caregiver or the medical facility. It is important for you to follow up on all of your test results.  SEEK IMMEDIATE MEDICAL ATTENTION IF:  You have more than a spotting of blood in your stool.   Your belly is swollen (abdominal distention).   You are nauseated or vomiting.   You have a temperature over 101.   You have abdominal pain or discomfort that is severe or gets worse throughout the day.    EGD Discharge instructions Please read the instructions outlined below and refer to this sheet in the next few weeks. These discharge instructions provide you with general information on caring for yourself after you leave the hospital. Your doctor may also give you specific instructions. While your treatment has been planned according to the most current medical practices available, unavoidable complications occasionally occur. If you have any problems or questions after discharge, please call your doctor. ACTIVITY  You may resume your regular activity but move at a slower pace for the next 24 hours.   Take frequent rest periods for the next 24 hours.   Walking will help expel (get rid of) the air and reduce the bloated feeling in your abdomen.   No driving for 24 hours (because of the anesthesia (medicine) used during the test).   You may shower.   Do not sign  any important legal documents or operate any machinery for 24 hours (because of the anesthesia used during the test).  NUTRITION  Drink plenty of fluids.   You may resume your normal diet.   Begin with a light meal and progress to your normal diet.   Avoid alcoholic beverages for 24 hours or as instructed by your caregiver.  MEDICATIONS  You may resume your normal medications unless your caregiver tells you otherwise.  WHAT YOU CAN EXPECT TODAY  You may experience abdominal discomfort such as a feeling of  fullness or gas pains.  FOLLOW-UP  Your doctor will discuss the results of your test with you.  SEEK IMMEDIATE MEDICAL ATTENTION IF ANY OF THE FOLLOWING OCCUR:  Excessive nausea (feeling sick to your stomach) and/or vomiting.   Severe abdominal pain and distention (swelling).   Trouble swallowing.   Temperature over 101 F (37.8 C).   Rectal bleeding or vomiting of blood.    Colon diverticulosis information provided  Continue omeprazole 20 mg daily  Further recommendations to follow pending review of pathology report    Diverticulosis Diverticulosis is a condition that develops when small pouches (diverticula) form in the wall of the large intestine (colon). The colon is where water is absorbed and stool is formed. The pouches form when the inside layer of the colon pushes through weak spots in the outer layers of the colon. You may have a few pouches or many of them. What are the causes? The cause of this condition is not known. What increases the risk? The following factors may make you more likely to develop this condition:  Being older than age 32. Your risk for this condition increases with age. Diverticulosis is rare among people younger than age 37. By age 45, many people have it.  Eating a low-fiber diet.  Having frequent constipation.  Being overweight.  Not getting enough exercise.  Smoking.  Taking over-the-counter pain medicines, like aspirin and ibuprofen.  Having a family history of diverticulosis.  What are the signs or symptoms? In most people, there are no symptoms of this condition. If you do have symptoms, they may include:  Bloating.  Cramps in the abdomen.  Constipation or diarrhea.  Pain in the lower left side of the abdomen.  How is this diagnosed? This condition is most often diagnosed during an exam for other colon problems. Because diverticulosis usually has no symptoms, it often cannot be diagnosed independently. This condition  may be diagnosed by:  Using a flexible scope to examine the colon (colonoscopy).  Taking an X-ray of the colon after dye has been put into the colon (barium enema).  Doing a CT scan.  How is this treated? You may not need treatment for this condition if you have never developed an infection related to diverticulosis. If you have had an infection before, treatment may include:  Eating a high-fiber diet. This may include eating more fruits, vegetables, and grains.  Taking a fiber supplement.  Taking a live bacteria supplement (probiotic).  Taking medicine to relax your colon.  Taking antibiotic medicines.  Follow these instructions at home:  Drink 6-8 glasses of water or more each day to prevent constipation.  Try not to strain when you have a bowel movement.  If you have had an infection before: ? Eat more fiber as directed by your health care provider or your diet and nutrition specialist (dietitian). ? Take a fiber supplement or probiotic, if your health care provider approves.  Take over-the-counter and  prescription medicines only as told by your health care provider.  If you were prescribed an antibiotic, take it as told by your health care provider. Do not stop taking the antibiotic even if you start to feel better.  Keep all follow-up visits as told by your health care provider. This is important. Contact a health care provider if:  You have pain in your abdomen.  You have bloating.  You have cramps.  You have not had a bowel movement in 3 days. Get help right away if:  Your pain gets worse.  Your bloating becomes very bad.  You have a fever or chills, and your symptoms suddenly get worse.  You vomit.  You have bowel movements that are bloody or black.  You have bleeding from your rectum. Summary  Diverticulosis is a condition that develops when small pouches (diverticula) form in the wall of the large intestine (colon).  You may have a few pouches  or many of them.  This condition is most often diagnosed during an exam for other colon problems.  If you have had an infection related to diverticulosis, treatment may include increasing the fiber in your diet, taking supplements, or taking medicines. This information is not intended to replace advice given to you by your health care provider. Make sure you discuss any questions you have with your health care provider. Document Released: 02/14/2004 Document Revised: 04/07/2016 Document Reviewed: 04/07/2016 Elsevier Interactive Patient Education  2017 [employer].

## 2017-03-16 NOTE — H&P (Signed)
@LOGO @   Primary Care Physician:  Alycia Rossetti, MD Primary Gastroenterologist:  Dr. Gala Romney  Pre-Procedure History & Physical: HPI:  [name] is a 83 y.o. male here for following with iron deficiency anemia and Hemoccult positive stool. Scheduled for colonoscopy possible EGD today. History of tubulovillous adenoma 2008. Patient did not return for follow-up colonoscopy. Denies any upper GI tract symptoms currently including reflux symptoms, odynophagia, dysphagia or early satiety nausea vomiting or abdominal pain.  Past Medical History:  Diagnosis Date  . Acid reflux   . Alcoholism /alcohol abuse ([place])   . Anemia   . Arthritis    OA  . Barrett's esophagus    ????  . Colon polyp   . Depression   . Dizzy   . H/O: UGI bleed   . Hearing impairment   . History of kidney stones   . HOH (hard of hearing)   . Hyperlipidemia   . Hypertension   . Knee pain   . MRSA (methicillin resistant staph aureus) culture positive    Sepsis of right knee  . Panic attacks   . Thrombocytopenia ([place])    Mild    Past Surgical History:  Procedure Laterality Date  . APPENDECTOMY    . bilateral cataract surgery    . CARDIAC CATHETERIZATION    . COLONOSCOPY  05/13/2007   Dr. Gala Romney: Diminutive rectosigmoid polyps ablated with the tip of the snare, large pedunculated polyp at the mid sigmoid colon resected, left-sided diverticula, polyps of the ascending colon and cecum removed. Adenomatous polyps. Large mid sigmoid polyp was tubulovillous adenoma.  . ESOPHAGOGASTRODUODENOSCOPY  04/30/2007   Dr. Gala Romney: severe ulcerative reflux esophagitis, localized area of adenomatous appearing firm raised sessile tissue distal esophagus suspicious for neoplasm. Large hiatal hernia. Biopsies from the esophagus were markedly abnormal but could not diagnose cancer.  . EUS  05/2007   pathology from paraesophageal lymph node and from esophagus were benign, cannot see EUS report  . KNEE SURGERY  4.2011   arthroscopic lavage, RIGHT knee for septic arthritis  . TONSILLECTOMY      Prior to Admission medications   Medication Sig Start Date End Date Taking? Authorizing Provider  amLODipine (NORVASC) 10 MG tablet TAKE ONE TABLET BY MOUTH DAILY FOR BLOOD PRESSURE. 03/05/17  Yes [place], Modena Nunnery, MD  aspirin (ASPIRIN LOW DOSE) 81 MG EC tablet Take 81 mg by mouth daily.     Yes [provider]  Cholecalciferol (VITAMIN D3) 2000 units TABS Take 2,000 Units by mouth daily.    Yes [provider]  doxepin (SINEQUAN) 25 MG capsule TAKE (1) CAPSULE BY MOUTH AT BEDTIME. Patient taking differently: TAKE 25 MG BY MOUTH AT BEDTIME. 12/22/16  Yes [place], Modena Nunnery, MD  ferrous sulfate (FE TABS) 325 (65 FE) MG EC tablet Take 1 tablet (325 mg total) by mouth daily with breakfast. 12/09/16  Yes [place], Modena Nunnery, MD  folic acid (FOLVITE) 174 MCG tablet Take 800 mcg by mouth daily.    Yes [provider]  HYDROcodone-acetaminophen (NORCO/VICODIN) 5-325 MG tablet TAKE 1 TABLET BY MOUTH EVERY EIGHT HOURS AS NEEDED FOR MODERATE ARTHRITIS PAIN. 02/19/17  Yes [place], Modena Nunnery, MD  LORazepam (ATIVAN) 1 MG tablet TAKE (1) TABLET BY MOUTH TWICE A DAY AS NEEDED. 03/10/17  Yes [place], Modena Nunnery, MD  Multiple Vitamin (MULTIVITAMIN) tablet Take 1 tablet by mouth daily.     Yes [provider]  naproxen (NAPROSYN) 375 MG tablet TAKE 1 TABLET BY MOUTH TWICE DAILY  WITH MEALS FOR ARTHRITIS. 03/12/17  Yes [place], Modena Nunnery, MD  omeprazole (PRILOSEC) 20 MG capsule TAKE 1 CAPSULE BY MOUTH DAILY. Patient taking differently: TAKE 20 MG BY MOUTH DAILY. 02/10/17  Yes Alycia Rossetti, MD    Allergies as of 02/10/2017 - Review Complete 02/10/2017  Allergen Reaction Noted  . Aspirin Other (See Comments) 08/24/2007    Family History  Problem Relation Age of Onset  . Colon cancer Mother   . Alzheimer's disease Mother   . Leukemia Father 66    Social History   Social History  . Marital status:  Widowed    Spouse name: N/A  . Number of children: N/A  . Years of education: N/A   Occupational History  . retired Engineer, structural     Social History Main Topics  . Smoking status: Former Smoker    Packs/day: 0.50    Years: 10.00    Types: Cigarettes    Quit date: 03/10/1950  . Smokeless tobacco: Never Used  . Alcohol use Yes     Comment: occasional with binge drinking  . Drug use: No  . Sexual activity: Yes   Other Topics Concern  . Not on file   Social History Narrative  . No narrative on file    Review of Systems: See HPI, otherwise negative ROS  Physical Exam: BP (!) 150/85   Pulse (!) 111   Temp 98.2 F (36.8 C) (Oral)   SpO2 95%  General:   Alert,  Well-developed, well-nourished, pleasant and cooperative in NAD Neck:  Supple; no masses or thyromegaly. No significant cervical adenopathy. Lungs:  Clear throughout to auscultation.   No wheezes, crackles, or rhonchi. No acute distress. Heart:  Regular rate and rhythm; no murmurs, clicks, rubs,  or gallops. Abdomen: Non-distended, normal bowel sounds.  Soft and nontender without appreciable mass or hepatosplenomegaly.  Pulses:  Normal pulses noted. Extremities:  Without clubbing or edema.  Impression / Recommendations:   Pleasant 83 year old gentleman presents with iron deficiency anemia and Hemoccult positive stool. Colonoscopy possible EGD has been offered to him today.  The risks, benefits, limitations, imponderables and alternatives regarding both EGD and colonoscopy have been reviewed with the patient. Questions have been answered. All parties agreeable.   Further recommendations to follow.       Notice: This dictation was prepared with Dragon dictation along with smaller phrase technology. Any transcriptional errors that result from this process are unintentional and may not be corrected upon review.

## 2017-03-17 ENCOUNTER — Encounter: Payer: Self-pay | Admitting: Internal Medicine

## 2017-03-18 ENCOUNTER — Encounter (HOSPITAL_COMMUNITY): Payer: Self-pay | Admitting: Internal Medicine

## 2017-03-19 ENCOUNTER — Other Ambulatory Visit: Payer: Self-pay | Admitting: Family Medicine

## 2017-03-19 NOTE — Telephone Encounter (Signed)
Patient is calling to get rx for his hydrocodone  [phone]

## 2017-03-19 NOTE — Telephone Encounter (Signed)
Okay to refill? 

## 2017-03-19 NOTE — Telephone Encounter (Signed)
Ok to refill??  Last office visit 11/28/2016.  Last refill 02/19/2017.

## 2017-03-20 MED ORDER — HYDROCODONE-ACETAMINOPHEN 5-325 MG PO TABS: ORAL_TABLET | ORAL | 0 refills | Status: DC

## 2017-03-20 NOTE — Telephone Encounter (Signed)
rx printed patient is here to pick up

## 2017-04-16 ENCOUNTER — Telehealth: Payer: Self-pay | Admitting: Family Medicine

## 2017-04-16 ENCOUNTER — Other Ambulatory Visit: Payer: Self-pay | Admitting: Family Medicine

## 2017-04-16 NOTE — Telephone Encounter (Signed)
Refill on hydrocodone

## 2017-04-16 NOTE — Telephone Encounter (Signed)
Okay to refill, should be due for OV

## 2017-04-16 NOTE — Telephone Encounter (Signed)
Ok to refill??  Last office visit 11/28/2016.  Last refill 03/20/2017.

## 2017-04-17 MED ORDER — HYDROCODONE-ACETAMINOPHEN 5-325 MG PO TABS: ORAL_TABLET | ORAL | 0 refills | Status: AC

## 2017-04-17 NOTE — Telephone Encounter (Signed)
Prescription printed.   Call placed to patient. No answer. No VM. 

## 2017-04-21 ENCOUNTER — Other Ambulatory Visit: Payer: Self-pay

## 2017-04-21 ENCOUNTER — Encounter: Payer: Self-pay | Admitting: Family Medicine

## 2017-04-21 ENCOUNTER — Ambulatory Visit (INDEPENDENT_AMBULATORY_CARE_PROVIDER_SITE_OTHER): Payer: Medicare Other | Admitting: Family Medicine

## 2017-04-21 VITALS — BP 138/82 | HR 96 | Temp 97.8°F | Resp 16 | Ht 70.5 in | Wt 201.0 lb

## 2017-04-21 DIAGNOSIS — D500 Iron deficiency anemia secondary to blood loss (chronic): Secondary | ICD-10-CM

## 2017-04-21 DIAGNOSIS — Z0000 Encounter for general adult medical examination without abnormal findings: Secondary | ICD-10-CM | POA: Diagnosis not present

## 2017-04-21 DIAGNOSIS — F331 Major depressive disorder, recurrent, moderate: Secondary | ICD-10-CM

## 2017-04-21 DIAGNOSIS — Z23 Encounter for immunization: Secondary | ICD-10-CM

## 2017-04-21 DIAGNOSIS — F419 Anxiety disorder, unspecified: Secondary | ICD-10-CM

## 2017-04-21 DIAGNOSIS — F5101 Primary insomnia: Secondary | ICD-10-CM | POA: Diagnosis not present

## 2017-04-21 MED ORDER — DOXEPIN HCL 50 MG PO CAPS: 50.0000 mg | ORAL_CAPSULE | Freq: Every day | ORAL | 6 refills | Status: AC

## 2017-04-21 NOTE — Progress Notes (Signed)
Subjective:   Patient presents for Medicare Annual/Subsequent preventive examination.    Pt her for CPE   Depression and ppor sleep states that he has a son that is on drugs.  He is also trying to figure out what he is going to do with his property.  We have at this discussion multiple times.  He does take the doxepin he states most nights but does not help him sleep.  He also takes his Lorazepam twice a day.  Iron def anemia- EGD showed gastric erosions and barretts esophogus- he is on iron therapy, and PPI, has f/u GI in 3 months   Has OA of knees, he wants to see a specialist in [place], he continues to take his hydrocodone as prescribed  Review Past Medical/Family/Social: Per EMR    Risk Factors  Current exercise habits: walks  Dietary issues discussed: Yes  Cardiac risk factors: Obesity (BMI >= 30 kg/m2).   Depression Screen  (Note: if answer to either of the following is "Yes", a more complete depression screening is indicated)  Over the past two weeks, have you felt down, depressed or hopeless? No Over the past two weeks, have you felt little interest or pleasure in doing things? No Have you lost interest or pleasure in daily life? No Do you often feel hopeless? No Do you cry easily over simple problems? No   Activities of Daily Living  In your present state of health, do you have any difficulty performing the following activities?:  Driving? No  Managing money? No  Feeding yourself? No  Getting from bed to chair? No  Climbing a flight of stairs? No  Preparing food and eating?: No  Bathing or showering? No  Getting dressed: No  Getting to the toilet? No  Using the toilet:No  Moving around from place to place: No  In the past year have you fallen or had a near fall?:No  Are you sexually active? Yes Do you have more than one partner? No   Hearing Difficulties:yes Do you often ask people to speak up or repeat themselves? yes  Do you experience ringing or noises in  your ears? No Do you have difficulty understanding soft or whispered voices? No  Do you feel that you have a problem with memory? No Do you often misplace items? No  Do you feel safe at home? Yes  Cognitive Testing  Alert? Yes Normal Appearance?Yes  Oriented to person? Yes Place? Yes  Time? Yes  Recall of three objects? Yes  Can perform simple calculations? Yes  Displays appropriate judgment?Yes  Can read the correct time from a watch face?Yes   List the Names of Other Physician/Practitioners you currently use:    Indicate any recent Medical Services you may have received from other than [facility] providers in the past year (date may be approximate).   Screening Tests / Date Colonoscopy  UTD                   Zostavax UTD PNEUMONIA- UTD Influenza Vaccine DUE Tetanus/tdap UTD  ROS: GEN- denies fatigue, fever, weight loss,weakness, recent illness HEENT- denies eye drainage, change in vision, nasal discharge, CVS- denies chest pain, palpitations RESP- denies SOB, cough, wheeze ABD- denies N/V, change in stools, abd pain GU- denies dysuria, hematuria, dribbling, incontinence MSK-+ joint pain, muscle aches, injury Neuro- denies headache, dizziness, syncope, seizure activity  PHYSICAL:  GEN- NAD, alert and oriented x3 HEENT- PERRL, EOMI, non injected sclera, pink conjunctiva, MMM, oropharynx clear Neck- Supple,  no thryomegaly CVS- RRR, no murmur RESP-CTAB ABD-NABS,soft,NT,ND Psych- normal affect and mood MSK- decreased ROM Bilat knee R>l no effusion, fair ROM, +crepitus, stiff gait  EXT- No edema Pulses- Radial 2+    Assessment:    Annual wellness medicare exam   Plan:    During the course of the visit the patient was educated and counseled about appropriate screening and preventive services including:  Immunizations-flu shot given.  He cannot afford the shingles vaccine tetanus up-to-date per report. Currently being treated for depression as well as insomnia. PHQ- 9  score of 13 (moderate depression).   Doxpenin increased to 50mg  to help nwiht sleep and depression  States family stress   We discussed advanced directives and he was given paperwork to complete.  At this time he is full code.  Hypertension blood pressure controlled no change in medication  Chronic osteoarthritis with known osteoporosis of his knee.  He has seen orthopedics in the past states that he wants to go to see a different when he will let me know who he wants to see.  He will continue with his hydrocodone also has low-dose Naprosyn   Iron deficiency anemia recheck his iron stores.  He had EGD which showed ulcerations as well as Barrett's esophagus.  He has follow-up with gastroenterology he is also on proton pump inhibitor we reviewed his scopes in the office today.   Diet review for nutrition referral? Yes ____ Not Indicated __x__  Patient Instructions (the written plan) was given to the patient.  Medicare Attestation  I have personally reviewed:  The patient's medical and social history  Their use of alcohol, tobacco or illicit drugs  Their current medications and supplements  The patient's functional ability including ADLs,fall risks, home safety risks, cognitive, and hearing and visual impairment  Diet and physical activities  Evidence for depression or mood disorders  The patient's weight, height, BMI, and visual acuity have been recorded in the chart. I have made referrals, counseling, and provided education to the patient based on review of the above and I have provided the patient with a written personalized care plan for preventive services.

## 2017-04-21 NOTE — Patient Instructions (Addendum)
Doxepin increased to 50mg  F/U 4 months  We will call with lab results Call me with orthopedic surgeon's name and referral will be made

## 2017-04-22 LAB — CBC WITH DIFFERENTIAL/PLATELET
Basophils Absolute: 77 cells/uL (ref 0–200)
Basophils Relative: 0.9 %
EOS ABS: 289 {cells}/uL (ref 15–500)
Eosinophils Relative: 3.4 %
HCT: 40.7 % (ref 38.5–50.0)
Hemoglobin: 13.3 g/dL (ref 13.2–17.1)
Lymphs Abs: 1717 cells/uL (ref 850–3900)
MCH: 27.5 pg (ref 27.0–33.0)
MCHC: 32.7 g/dL (ref 32.0–36.0)
MCV: 84.3 fL (ref 80.0–100.0)
MONOS PCT: 10.4 %
MPV: 10.5 fL (ref 7.5–12.5)
NEUTROS PCT: 65.1 %
Neutro Abs: 5534 cells/uL (ref 1500–7800)
PLATELETS: 278 10*3/uL (ref 140–400)
RBC: 4.83 10*6/uL (ref 4.20–5.80)
RDW: 14.5 % (ref 11.0–15.0)
TOTAL LYMPHOCYTE: 20.2 %
WBC: 8.5 10*3/uL (ref 3.8–10.8)
WBCMIX: 884 {cells}/uL (ref 200–950)

## 2017-04-22 LAB — IRON,TIBC AND FERRITIN PANEL
%SAT: 38 % (calc) (ref 15–60)
Ferritin: 28 ng/mL (ref 20–380)
Iron: 158 ug/dL (ref 50–180)
TIBC: 418 mcg/dL (calc) (ref 250–425)

## 2017-04-22 LAB — COMPREHENSIVE METABOLIC PANEL
AG Ratio: 1.6 (calc) (ref 1.0–2.5)
ALT: 14 U/L (ref 9–46)
AST: 17 U/L (ref 10–35)
Albumin: 4.6 g/dL (ref 3.6–5.1)
Alkaline phosphatase (APISO): 86 U/L (ref 40–115)
BILIRUBIN TOTAL: 0.5 mg/dL (ref 0.2–1.2)
BUN: 16 mg/dL (ref 7–25)
CALCIUM: 9.5 mg/dL (ref 8.6–10.3)
CHLORIDE: 103 mmol/L (ref 98–110)
CO2: 25 mmol/L (ref 20–32)
Creat: 0.88 mg/dL (ref 0.70–1.11)
Globulin: 2.8 g/dL (calc) (ref 1.9–3.7)
Glucose, Bld: 95 mg/dL (ref 65–99)
POTASSIUM: 4.6 mmol/L (ref 3.5–5.3)
SODIUM: 139 mmol/L (ref 135–146)
Total Protein: 7.4 g/dL (ref 6.1–8.1)

## 2017-04-27 ENCOUNTER — Encounter: Payer: Self-pay | Admitting: *Deleted

## 2017-04-29 ENCOUNTER — Other Ambulatory Visit: Payer: Self-pay | Admitting: Family Medicine

## 2017-06-02 DEATH — deceased

## 2017-06-17 ENCOUNTER — Encounter: Payer: Self-pay | Admitting: Gastroenterology

## 2017-06-17 ENCOUNTER — Ambulatory Visit: Payer: Medicare Other | Admitting: Gastroenterology

## 2017-06-17 ENCOUNTER — Telehealth: Payer: Self-pay | Admitting: Gastroenterology

## 2017-06-17 NOTE — Telephone Encounter (Signed)
PATIENT WAS A NO SHOW AND LETTER SENT  °

## 2017-07-01 ENCOUNTER — Telehealth: Payer: Self-pay | Admitting: *Deleted
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
## 2017-07-02 NOTE — Telephone Encounter (Signed)
Per office manager, our office will require release to be signed before records can be given.   Call placed to Mr [name] and he was made aware. Will come to office to sign release.
